# Patient Record
Sex: Male | Born: 1960 | Race: Black or African American | Hispanic: No | Marital: Single | Smoking: Never smoker
Health system: Southern US, Community
[De-identification: ages and names within clinical notes are randomized; demographics above are authoritative.]

## PROBLEM LIST (undated history)

## (undated) ENCOUNTER — Emergency Department (HOSPITAL_COMMUNITY): Admission: EM | Payer: Medicaid Other | Source: Home / Self Care

## (undated) DIAGNOSIS — J309 Allergic rhinitis, unspecified: Secondary | ICD-10-CM

## (undated) DIAGNOSIS — E1121 Type 2 diabetes mellitus with diabetic nephropathy: Secondary | ICD-10-CM

## (undated) DIAGNOSIS — N183 Chronic kidney disease, stage 3 unspecified: Secondary | ICD-10-CM

## (undated) DIAGNOSIS — E559 Vitamin D deficiency, unspecified: Secondary | ICD-10-CM

## (undated) DIAGNOSIS — E119 Type 2 diabetes mellitus without complications: Secondary | ICD-10-CM

## (undated) DIAGNOSIS — F79 Unspecified intellectual disabilities: Secondary | ICD-10-CM

## (undated) DIAGNOSIS — K219 Gastro-esophageal reflux disease without esophagitis: Secondary | ICD-10-CM

## (undated) DIAGNOSIS — I10 Essential (primary) hypertension: Secondary | ICD-10-CM

## (undated) DIAGNOSIS — E785 Hyperlipidemia, unspecified: Secondary | ICD-10-CM

## (undated) HISTORY — DX: Chronic kidney disease, stage 3 unspecified: N18.30

## (undated) HISTORY — DX: Type 2 diabetes mellitus without complications: E11.9

## (undated) HISTORY — DX: Allergic rhinitis, unspecified: J30.9

## (undated) HISTORY — DX: Gastro-esophageal reflux disease without esophagitis: K21.9

## (undated) HISTORY — DX: Hyperlipidemia, unspecified: E78.5

## (undated) HISTORY — DX: Unspecified intellectual disabilities: F79

## (undated) HISTORY — DX: Vitamin D deficiency, unspecified: E55.9

## (undated) HISTORY — DX: Type 2 diabetes mellitus with diabetic nephropathy: E11.21

## (undated) HISTORY — PX: APPENDECTOMY: SHX54

---

## 1998-04-19 ENCOUNTER — Emergency Department (HOSPITAL_COMMUNITY): Admission: EM | Admit: 1998-04-19 | Discharge: 1998-04-19 | Payer: Self-pay | Admitting: Emergency Medicine

## 1998-10-21 ENCOUNTER — Emergency Department (HOSPITAL_COMMUNITY): Admission: EM | Admit: 1998-10-21 | Discharge: 1998-10-21 | Payer: Self-pay | Admitting: Emergency Medicine

## 1998-10-30 ENCOUNTER — Encounter: Payer: Self-pay | Admitting: Emergency Medicine

## 1998-10-30 ENCOUNTER — Inpatient Hospital Stay (HOSPITAL_COMMUNITY): Admission: EM | Admit: 1998-10-30 | Discharge: 1998-11-01 | Payer: Self-pay | Admitting: Emergency Medicine

## 1998-11-03 ENCOUNTER — Encounter: Admission: RE | Admit: 1998-11-03 | Discharge: 1998-11-03 | Payer: Self-pay | Admitting: Internal Medicine

## 1998-11-06 ENCOUNTER — Emergency Department (HOSPITAL_COMMUNITY): Admission: EM | Admit: 1998-11-06 | Discharge: 1998-11-06 | Payer: Self-pay | Admitting: Emergency Medicine

## 1998-11-07 ENCOUNTER — Encounter: Payer: Self-pay | Admitting: Emergency Medicine

## 1998-11-29 ENCOUNTER — Encounter: Admission: RE | Admit: 1998-11-29 | Discharge: 1998-11-29 | Payer: Self-pay | Admitting: Internal Medicine

## 1999-02-01 ENCOUNTER — Encounter: Admission: RE | Admit: 1999-02-01 | Discharge: 1999-02-01 | Payer: Self-pay | Admitting: Internal Medicine

## 1999-06-28 ENCOUNTER — Encounter: Admission: RE | Admit: 1999-06-28 | Discharge: 1999-06-28 | Payer: Self-pay | Admitting: Internal Medicine

## 1999-07-01 ENCOUNTER — Encounter: Admission: RE | Admit: 1999-07-01 | Discharge: 1999-07-01 | Payer: Self-pay | Admitting: Internal Medicine

## 1999-12-13 ENCOUNTER — Encounter: Admission: RE | Admit: 1999-12-13 | Discharge: 1999-12-13 | Payer: Self-pay | Admitting: Internal Medicine

## 1999-12-23 ENCOUNTER — Encounter: Admission: RE | Admit: 1999-12-23 | Discharge: 1999-12-23 | Payer: Self-pay | Admitting: Internal Medicine

## 2000-01-13 ENCOUNTER — Encounter: Admission: RE | Admit: 2000-01-13 | Discharge: 2000-01-13 | Payer: Self-pay | Admitting: Internal Medicine

## 2000-05-29 ENCOUNTER — Encounter: Admission: RE | Admit: 2000-05-29 | Discharge: 2000-05-29 | Payer: Self-pay | Admitting: Internal Medicine

## 2000-10-30 ENCOUNTER — Emergency Department (HOSPITAL_COMMUNITY): Admission: EM | Admit: 2000-10-30 | Discharge: 2000-10-30 | Payer: Self-pay

## 2001-05-13 ENCOUNTER — Encounter: Payer: Self-pay | Admitting: Emergency Medicine

## 2001-05-13 ENCOUNTER — Emergency Department (HOSPITAL_COMMUNITY): Admission: EM | Admit: 2001-05-13 | Discharge: 2001-05-14 | Payer: Self-pay | Admitting: Emergency Medicine

## 2001-08-20 ENCOUNTER — Encounter: Admission: RE | Admit: 2001-08-20 | Discharge: 2001-08-20 | Payer: Self-pay | Admitting: Internal Medicine

## 2001-10-30 ENCOUNTER — Emergency Department (HOSPITAL_COMMUNITY): Admission: EM | Admit: 2001-10-30 | Discharge: 2001-10-30 | Payer: Self-pay | Admitting: Emergency Medicine

## 2001-12-21 ENCOUNTER — Emergency Department (HOSPITAL_COMMUNITY): Admission: EM | Admit: 2001-12-21 | Discharge: 2001-12-21 | Payer: Self-pay | Admitting: Emergency Medicine

## 2002-10-14 ENCOUNTER — Encounter: Payer: Self-pay | Admitting: Emergency Medicine

## 2002-10-14 ENCOUNTER — Emergency Department (HOSPITAL_COMMUNITY): Admission: EM | Admit: 2002-10-14 | Discharge: 2002-10-14 | Payer: Self-pay | Admitting: Emergency Medicine

## 2002-12-30 ENCOUNTER — Encounter: Admission: RE | Admit: 2002-12-30 | Discharge: 2002-12-30 | Payer: Self-pay | Admitting: Internal Medicine

## 2003-10-16 ENCOUNTER — Encounter: Admission: RE | Admit: 2003-10-16 | Discharge: 2003-10-16 | Payer: Self-pay | Admitting: Internal Medicine

## 2003-10-16 ENCOUNTER — Emergency Department (HOSPITAL_COMMUNITY): Admission: EM | Admit: 2003-10-16 | Discharge: 2003-10-16 | Payer: Self-pay | Admitting: Family Medicine

## 2003-10-30 ENCOUNTER — Encounter: Admission: RE | Admit: 2003-10-30 | Discharge: 2003-10-30 | Payer: Self-pay | Admitting: Internal Medicine

## 2004-05-19 ENCOUNTER — Ambulatory Visit: Payer: Self-pay | Admitting: Internal Medicine

## 2004-07-05 ENCOUNTER — Ambulatory Visit: Payer: Self-pay | Admitting: Internal Medicine

## 2004-07-12 ENCOUNTER — Emergency Department (HOSPITAL_COMMUNITY): Admission: EM | Admit: 2004-07-12 | Discharge: 2004-07-12 | Payer: Self-pay | Admitting: Emergency Medicine

## 2004-07-15 ENCOUNTER — Ambulatory Visit: Payer: Self-pay | Admitting: Internal Medicine

## 2004-08-23 ENCOUNTER — Ambulatory Visit: Payer: Self-pay | Admitting: Internal Medicine

## 2004-09-02 ENCOUNTER — Ambulatory Visit (HOSPITAL_COMMUNITY): Admission: RE | Admit: 2004-09-02 | Discharge: 2004-09-02 | Payer: Self-pay | Admitting: Internal Medicine

## 2004-10-13 ENCOUNTER — Emergency Department (HOSPITAL_COMMUNITY): Admission: EM | Admit: 2004-10-13 | Discharge: 2004-10-13 | Payer: Self-pay | Admitting: Family Medicine

## 2004-12-07 ENCOUNTER — Ambulatory Visit: Payer: Self-pay | Admitting: Internal Medicine

## 2005-06-26 ENCOUNTER — Emergency Department (HOSPITAL_COMMUNITY): Admission: EM | Admit: 2005-06-26 | Discharge: 2005-06-26 | Payer: Self-pay | Admitting: Family Medicine

## 2005-09-08 ENCOUNTER — Emergency Department (HOSPITAL_COMMUNITY): Admission: EM | Admit: 2005-09-08 | Discharge: 2005-09-08 | Payer: Self-pay | Admitting: Family Medicine

## 2005-10-05 ENCOUNTER — Emergency Department (HOSPITAL_COMMUNITY): Admission: EM | Admit: 2005-10-05 | Discharge: 2005-10-05 | Payer: Self-pay | Admitting: Emergency Medicine

## 2005-10-31 ENCOUNTER — Emergency Department (HOSPITAL_COMMUNITY): Admission: EM | Admit: 2005-10-31 | Discharge: 2005-10-31 | Payer: Self-pay | Admitting: Emergency Medicine

## 2005-11-07 ENCOUNTER — Ambulatory Visit: Payer: Self-pay | Admitting: Internal Medicine

## 2006-04-24 ENCOUNTER — Ambulatory Visit: Payer: Self-pay | Admitting: Internal Medicine

## 2006-05-23 DIAGNOSIS — R0789 Other chest pain: Secondary | ICD-10-CM

## 2006-05-23 DIAGNOSIS — B350 Tinea barbae and tinea capitis: Secondary | ICD-10-CM

## 2006-05-23 DIAGNOSIS — I10 Essential (primary) hypertension: Secondary | ICD-10-CM

## 2006-05-23 DIAGNOSIS — F70 Mild intellectual disabilities: Secondary | ICD-10-CM

## 2006-05-23 DIAGNOSIS — J309 Allergic rhinitis, unspecified: Secondary | ICD-10-CM | POA: Insufficient documentation

## 2006-05-23 DIAGNOSIS — I498 Other specified cardiac arrhythmias: Secondary | ICD-10-CM | POA: Insufficient documentation

## 2006-05-23 DIAGNOSIS — N259 Disorder resulting from impaired renal tubular function, unspecified: Secondary | ICD-10-CM | POA: Insufficient documentation

## 2006-05-23 DIAGNOSIS — K5900 Constipation, unspecified: Secondary | ICD-10-CM | POA: Insufficient documentation

## 2006-05-23 DIAGNOSIS — D539 Nutritional anemia, unspecified: Secondary | ICD-10-CM | POA: Insufficient documentation

## 2006-06-22 DIAGNOSIS — I10 Essential (primary) hypertension: Secondary | ICD-10-CM | POA: Insufficient documentation

## 2006-06-26 ENCOUNTER — Ambulatory Visit: Payer: Self-pay | Admitting: Internal Medicine

## 2006-10-25 ENCOUNTER — Telehealth: Payer: Self-pay | Admitting: *Deleted

## 2006-10-30 ENCOUNTER — Emergency Department (HOSPITAL_COMMUNITY): Admission: EM | Admit: 2006-10-30 | Discharge: 2006-10-30 | Payer: Self-pay | Admitting: Emergency Medicine

## 2006-12-12 ENCOUNTER — Telehealth: Payer: Self-pay | Admitting: *Deleted

## 2006-12-19 ENCOUNTER — Telehealth: Payer: Self-pay | Admitting: *Deleted

## 2007-04-15 ENCOUNTER — Telehealth: Payer: Self-pay | Admitting: *Deleted

## 2007-04-25 ENCOUNTER — Emergency Department (HOSPITAL_COMMUNITY): Admission: EM | Admit: 2007-04-25 | Discharge: 2007-04-25 | Payer: Self-pay | Admitting: Emergency Medicine

## 2007-05-29 ENCOUNTER — Ambulatory Visit: Payer: Self-pay | Admitting: Internal Medicine

## 2007-06-04 ENCOUNTER — Ambulatory Visit: Payer: Self-pay | Admitting: Internal Medicine

## 2007-06-04 LAB — CONVERTED CEMR LAB
ALT: 33 units/L (ref 0–53)
Albumin: 4.7 g/dL (ref 3.5–5.2)
Basophils Absolute: 0 10*3/uL (ref 0.0–0.1)
CO2: 28 meq/L (ref 19–32)
Calcium: 9.5 mg/dL (ref 8.4–10.5)
Chloride: 102 meq/L (ref 96–112)
Hemoglobin: 13.7 g/dL (ref 13.0–17.0)
Lymphocytes Relative: 39 % (ref 12–46)
Monocytes Absolute: 0.4 10*3/uL (ref 0.1–1.0)
Neutro Abs: 1.8 10*3/uL (ref 1.7–7.7)
Platelets: 400 10*3/uL (ref 150–400)
Potassium: 4.2 meq/L (ref 3.5–5.3)
RDW: 12.9 % (ref 11.5–15.5)
Sodium: 140 meq/L (ref 135–145)
TSH: 0.859 microintl units/mL (ref 0.350–5.50)
Total Protein: 7.4 g/dL (ref 6.0–8.3)

## 2007-06-19 ENCOUNTER — Ambulatory Visit: Payer: Self-pay | Admitting: Internal Medicine

## 2007-07-16 ENCOUNTER — Ambulatory Visit: Payer: Self-pay | Admitting: Internal Medicine

## 2007-07-16 LAB — CONVERTED CEMR LAB
Calcium: 9.8 mg/dL (ref 8.4–10.5)
Creatinine, Ser: 1.28 mg/dL (ref 0.40–1.50)
HDL: 44 mg/dL (ref >39)
Triglycerides: 62 mg/dL (ref <150)

## 2007-07-22 ENCOUNTER — Ambulatory Visit: Payer: Self-pay | Admitting: Internal Medicine

## 2007-08-26 ENCOUNTER — Ambulatory Visit: Payer: Self-pay | Admitting: Internal Medicine

## 2007-10-28 ENCOUNTER — Ambulatory Visit: Payer: Self-pay | Admitting: Family Medicine

## 2007-12-20 ENCOUNTER — Ambulatory Visit: Payer: Self-pay | Admitting: Internal Medicine

## 2008-04-16 ENCOUNTER — Ambulatory Visit: Payer: Self-pay | Admitting: Internal Medicine

## 2008-04-16 ENCOUNTER — Encounter: Payer: Self-pay | Admitting: Family Medicine

## 2008-04-16 LAB — CONVERTED CEMR LAB
CO2: 25 meq/L (ref 19–32)
Calcium: 9.5 mg/dL (ref 8.4–10.5)
Chloride: 103 meq/L (ref 96–112)
Creatinine, Ser: 1.41 mg/dL (ref 0.40–1.50)
Glucose, Bld: 108 mg/dL — ABNORMAL HIGH (ref 70–99)
LDL Cholesterol: 168 mg/dL — ABNORMAL HIGH (ref 0–99)
PSA: 0.4 ng/mL (ref 0.10–4.00)
Sodium: 139 meq/L (ref 135–145)
Total CHOL/HDL Ratio: 6

## 2008-04-22 ENCOUNTER — Ambulatory Visit: Payer: Self-pay | Admitting: Internal Medicine

## 2008-08-10 ENCOUNTER — Encounter: Payer: Self-pay | Admitting: Family Medicine

## 2008-08-10 ENCOUNTER — Ambulatory Visit: Payer: Self-pay | Admitting: Internal Medicine

## 2008-08-10 LAB — CONVERTED CEMR LAB
ALT: 27 units/L (ref 0–53)
AST: 30 units/L (ref 0–37)
CO2: 24 meq/L (ref 19–32)
Cholesterol: 183 mg/dL (ref 0–200)
LDL Cholesterol: 130 mg/dL — ABNORMAL HIGH (ref 0–99)
Sodium: 141 meq/L (ref 135–145)
Total Bilirubin: 0.7 mg/dL (ref 0.3–1.2)
Total CHOL/HDL Ratio: 4.8
Total Protein: 7.7 g/dL (ref 6.0–8.3)
VLDL: 15 mg/dL (ref 0–40)

## 2008-08-21 ENCOUNTER — Ambulatory Visit: Payer: Self-pay | Admitting: Internal Medicine

## 2008-09-18 ENCOUNTER — Ambulatory Visit: Payer: Self-pay | Admitting: Internal Medicine

## 2008-11-16 ENCOUNTER — Encounter: Payer: Self-pay | Admitting: Family Medicine

## 2008-11-16 ENCOUNTER — Ambulatory Visit: Payer: Self-pay | Admitting: Internal Medicine

## 2008-11-16 LAB — CONVERTED CEMR LAB
ALT: 34 units/L (ref 0–53)
AST: 27 units/L (ref 0–37)
Albumin: 4.4 g/dL (ref 3.5–5.2)
Alkaline Phosphatase: 65 units/L (ref 39–117)
BUN: 13 mg/dL (ref 6–23)
Calcium: 9.8 mg/dL (ref 8.4–10.5)
Chloride: 102 meq/L (ref 96–112)
LDL Cholesterol: 78 mg/dL (ref 0–99)
Potassium: 4.2 meq/L (ref 3.5–5.3)
Sodium: 139 meq/L (ref 135–145)
Total Protein: 7.3 g/dL (ref 6.0–8.3)

## 2008-11-23 ENCOUNTER — Ambulatory Visit: Payer: Self-pay | Admitting: Internal Medicine

## 2009-02-24 ENCOUNTER — Ambulatory Visit: Payer: Self-pay | Admitting: Internal Medicine

## 2009-06-30 ENCOUNTER — Ambulatory Visit: Payer: Self-pay | Admitting: Internal Medicine

## 2009-07-21 ENCOUNTER — Ambulatory Visit: Payer: Self-pay | Admitting: Internal Medicine

## 2009-07-21 LAB — CONVERTED CEMR LAB
CO2: 27 meq/L (ref 19–32)
Calcium: 10.2 mg/dL (ref 8.4–10.5)
Chloride: 99 meq/L (ref 96–112)
Glucose, Bld: 102 mg/dL — ABNORMAL HIGH (ref 70–99)
HDL: 41 mg/dL (ref >39)
Potassium: 4.4 meq/L (ref 3.5–5.3)
Sodium: 138 meq/L (ref 135–145)
Total CHOL/HDL Ratio: 4.9
VLDL: 16 mg/dL (ref 0–40)

## 2009-07-28 ENCOUNTER — Ambulatory Visit: Payer: Self-pay | Admitting: Internal Medicine

## 2009-08-25 ENCOUNTER — Ambulatory Visit: Payer: Self-pay | Admitting: Internal Medicine

## 2009-08-25 LAB — CONVERTED CEMR LAB
Calcium: 9.7 mg/dL (ref 8.4–10.5)
Glucose, Bld: 113 mg/dL — ABNORMAL HIGH (ref 70–99)
Hgb A1c MFr Bld: 5.5 % (ref 4.6–6.1)
Potassium: 4.1 meq/L (ref 3.5–5.3)
Sodium: 136 meq/L (ref 135–145)

## 2009-09-01 ENCOUNTER — Ambulatory Visit: Payer: Self-pay | Admitting: Internal Medicine

## 2009-09-30 ENCOUNTER — Ambulatory Visit: Payer: Self-pay | Admitting: Internal Medicine

## 2009-09-30 LAB — CONVERTED CEMR LAB
CO2: 25 meq/L (ref 19–32)
Calcium: 9 mg/dL (ref 8.4–10.5)
Chloride: 102 meq/L (ref 96–112)
Creatinine, Ser: 1.4 mg/dL (ref 0.40–1.50)
Sodium: 138 meq/L (ref 135–145)

## 2009-10-10 ENCOUNTER — Emergency Department (HOSPITAL_COMMUNITY): Admission: EM | Admit: 2009-10-10 | Discharge: 2009-10-10 | Payer: Self-pay | Admitting: Emergency Medicine

## 2009-11-18 ENCOUNTER — Ambulatory Visit: Payer: Self-pay | Admitting: Internal Medicine

## 2009-11-18 LAB — CONVERTED CEMR LAB
Albumin: 4.8 g/dL (ref 3.5–5.2)
BUN: 14 mg/dL (ref 6–23)
CO2: 30 meq/L (ref 19–32)
CRP: 0.3 mg/dL (ref <0.6)
Calcium: 10 mg/dL (ref 8.4–10.5)
Chloride: 96 meq/L (ref 96–112)
Eosinophils Relative: 3 % (ref 0–5)
Glucose, Bld: 104 mg/dL — ABNORMAL HIGH (ref 70–99)
HCT: 41.9 % (ref 39.0–52.0)
Hemoglobin: 14.2 g/dL (ref 13.0–17.0)
Lymphocytes Relative: 32 % (ref 12–46)
Microalb, Ur: 2.94 mg/dL — ABNORMAL HIGH (ref 0.00–1.89)
Monocytes Absolute: 0.3 10*3/uL (ref 0.1–1.0)
Monocytes Relative: 5 % (ref 3–12)
Neutro Abs: 3.5 10*3/uL (ref 1.7–7.7)
Potassium: 3.5 meq/L (ref 3.5–5.3)
RBC: 4.09 M/uL — ABNORMAL LOW (ref 4.22–5.81)
Sodium: 139 meq/L (ref 135–145)
Total Protein: 7.8 g/dL (ref 6.0–8.3)
Uric Acid, Serum: 9.1 mg/dL — ABNORMAL HIGH (ref 4.0–7.8)

## 2010-01-24 ENCOUNTER — Ambulatory Visit: Payer: Self-pay | Admitting: Internal Medicine

## 2010-02-21 ENCOUNTER — Ambulatory Visit: Payer: Self-pay | Admitting: Internal Medicine

## 2010-02-21 LAB — CONVERTED CEMR LAB
BUN: 19 mg/dL (ref 6–23)
Calcium: 9.7 mg/dL (ref 8.4–10.5)
Chloride: 99 meq/L (ref 96–112)
Cholesterol: 215 mg/dL — ABNORMAL HIGH (ref 0–200)
Creatinine, Ser: 1.26 mg/dL (ref 0.40–1.50)
LDL Cholesterol: 158 mg/dL — ABNORMAL HIGH (ref 0–99)
Triglycerides: 82 mg/dL (ref <150)

## 2010-05-09 ENCOUNTER — Encounter (INDEPENDENT_AMBULATORY_CARE_PROVIDER_SITE_OTHER): Payer: Self-pay | Admitting: *Deleted

## 2010-05-09 LAB — CONVERTED CEMR LAB
Albumin: 4.5 g/dL (ref 3.5–5.2)
Alkaline Phosphatase: 63 units/L (ref 39–117)
Calcium: 9.6 mg/dL (ref 8.4–10.5)
Chloride: 100 meq/L (ref 96–112)
Glucose, Bld: 114 mg/dL — ABNORMAL HIGH (ref 70–99)
LDL Cholesterol: 170 mg/dL — ABNORMAL HIGH (ref 0–99)
Potassium: 4 meq/L (ref 3.5–5.3)
Sodium: 139 meq/L (ref 135–145)
Total Protein: 7.5 g/dL (ref 6.0–8.3)
Triglycerides: 92 mg/dL (ref <150)

## 2010-07-06 ENCOUNTER — Inpatient Hospital Stay (HOSPITAL_COMMUNITY)
Admission: EM | Admit: 2010-07-06 | Discharge: 2010-07-23 | DRG: 339 | Disposition: A | Discharge status: Home / Self Care | Payer: Medicaid Other | Source: Ambulatory Visit | Attending: Surgery | Admitting: Surgery

## 2010-07-06 DIAGNOSIS — E860 Dehydration: Secondary | ICD-10-CM | POA: Diagnosis present

## 2010-07-06 DIAGNOSIS — K3533 Acute appendicitis with perforation and localized peritonitis, with abscess: Principal | ICD-10-CM | POA: Diagnosis present

## 2010-07-06 DIAGNOSIS — E7800 Pure hypercholesterolemia, unspecified: Secondary | ICD-10-CM | POA: Diagnosis present

## 2010-07-06 DIAGNOSIS — K929 Disease of digestive system, unspecified: Secondary | ICD-10-CM | POA: Diagnosis not present

## 2010-07-06 DIAGNOSIS — E876 Hypokalemia: Secondary | ICD-10-CM | POA: Diagnosis present

## 2010-07-06 DIAGNOSIS — F79 Unspecified intellectual disabilities: Secondary | ICD-10-CM | POA: Diagnosis present

## 2010-07-06 DIAGNOSIS — K560 Paralytic ileus: Secondary | ICD-10-CM | POA: Diagnosis not present

## 2010-07-06 DIAGNOSIS — N179 Acute kidney failure, unspecified: Secondary | ICD-10-CM | POA: Diagnosis present

## 2010-07-06 HISTORY — DX: Essential (primary) hypertension: I10

## 2010-07-06 LAB — POCT I-STAT, CHEM 8
BUN: 22 mg/dL (ref 6–23)
Calcium, Ion: 1 mmol/L — ABNORMAL LOW (ref 1.12–1.32)
Chloride: 94 mEq/L — ABNORMAL LOW (ref 96–112)
Creatinine, Ser: 2.2 mg/dL — ABNORMAL HIGH (ref 0.4–1.5)
Glucose, Bld: 116 mg/dL — ABNORMAL HIGH (ref 70–99)
HCT: 43 % (ref 39.0–52.0)
Hemoglobin: 14.6 g/dL (ref 13.0–17.0)
Potassium: 2.6 mEq/L — CL (ref 3.5–5.1)
Sodium: 135 mEq/L (ref 135–145)
TCO2: 32 mmol/L (ref 0–100)

## 2010-07-06 LAB — DIFFERENTIAL
Basophils Absolute: 0 10*3/uL (ref 0.0–0.1)
Basophils Relative: 0 % (ref 0–1)
Lymphocytes Relative: 4 % — ABNORMAL LOW (ref 12–46)
Monocytes Absolute: 1 10*3/uL (ref 0.1–1.0)
Neutro Abs: 20.3 10*3/uL — ABNORMAL HIGH (ref 1.7–7.7)
Neutrophils Relative %: 91 % — ABNORMAL HIGH (ref 43–77)

## 2010-07-06 LAB — CBC
HCT: 38.4 % — ABNORMAL LOW (ref 39.0–52.0)
Hemoglobin: 13.1 g/dL (ref 13.0–17.0)
MCHC: 34.1 g/dL (ref 30.0–36.0)
RBC: 3.76 MIL/uL — ABNORMAL LOW (ref 4.22–5.81)
WBC: 22.2 10*3/uL — ABNORMAL HIGH (ref 4.0–10.5)

## 2010-07-06 LAB — URINE MICROSCOPIC-ADD ON

## 2010-07-06 LAB — URINALYSIS, ROUTINE W REFLEX MICROSCOPIC
Protein, ur: 100 mg/dL — AB
Specific Gravity, Urine: 1.019 (ref 1.005–1.030)
Urine Glucose, Fasting: NEGATIVE mg/dL

## 2010-07-07 LAB — CBC
Hemoglobin: 11.6 g/dL — ABNORMAL LOW (ref 13.0–17.0)
MCH: 34.4 pg — ABNORMAL HIGH (ref 26.0–34.0)
MCHC: 34.3 g/dL (ref 30.0–36.0)
MCV: 100.3 fL — ABNORMAL HIGH (ref 78.0–100.0)
Platelets: 315 10*3/uL (ref 150–400)
RBC: 3.37 MIL/uL — ABNORMAL LOW (ref 4.22–5.81)

## 2010-07-07 LAB — URINE CULTURE
Colony Count: NO GROWTH
Culture  Setup Time: 201201252011
Culture: NO GROWTH

## 2010-07-07 LAB — BASIC METABOLIC PANEL
BUN: 16 mg/dL (ref 6–23)
BUN: 15 mg/dL (ref 6–23)
CO2: 25 mEq/L (ref 19–32)
CO2: 26 mEq/L (ref 19–32)
Chloride: 101 mEq/L (ref 96–112)
Chloride: 101 mEq/L (ref 96–112)
Creatinine, Ser: 1.68 mg/dL — ABNORMAL HIGH (ref 0.4–1.5)
Glucose, Bld: 105 mg/dL — ABNORMAL HIGH (ref 70–99)
Potassium: 2.9 mEq/L — ABNORMAL LOW (ref 3.5–5.1)

## 2010-07-08 LAB — CBC
HCT: 33 % — ABNORMAL LOW (ref 39.0–52.0)
MCHC: 34.2 g/dL (ref 30.0–36.0)
RDW: 12.8 % (ref 11.5–15.5)

## 2010-07-08 LAB — PTH, INTACT AND CALCIUM
Calcium, Total (PTH): 8.3 mg/dL — ABNORMAL LOW (ref 8.4–10.5)
PTH: 43.9 pg/mL (ref 14.0–72.0)

## 2010-07-08 LAB — COMPREHENSIVE METABOLIC PANEL
AST: 43 U/L — ABNORMAL HIGH (ref 0–37)
CO2: 27 mEq/L (ref 19–32)
Calcium: 8.6 mg/dL (ref 8.4–10.5)
Creatinine, Ser: 1.48 mg/dL (ref 0.4–1.5)
GFR calc Af Amer: >60 mL/min (ref >60)
GFR calc non Af Amer: 50 mL/min — ABNORMAL LOW (ref >60)

## 2010-07-08 LAB — LACTIC ACID, PLASMA: Lactic Acid, Venous: 0.9 mmol/L (ref 0.5–2.2)

## 2010-07-08 NOTE — Consult Note (Signed)
NAME:  Travis Barrera, HUOT NO.:  [id number]  MEDICAL RECORD NO.:  [id number]          PATIENT TYPE:  INP  LOCATION:  6739                         FACILITY:  [facility]  PHYSICIAN:  Jaqwan L. Dollie Bressi, M.D.DATE OF BIRTH:  09/01/1960  DATE OF CONSULTATION: DATE OF DISCHARGE:                                CONSULTATION   REASON FOR CONSULT:  Renal insufficiency.  HISTORY OF PRESENT ILLNESS:  This is a 50 year old gentleman with a history of mental retardation, history of hypertension, and hyperlipidemia that was treated about 3-4 months.  He presented with over 24 hours of refractory nausea, vomiting, and severe abdominal pain caused him to curl up.  His CT in the ER was suspicious for appendicitis.  He was admitted for evaluation of that.  He has been febrile.  Also, he is on IV antibiotics.  Potassium is low in the emergency room.  His phosphorus is 1.  Creatinine was 2.2 on admission and is 1.68 today.  No known history of renal disease.  No history of UTIs, stones, or family history of renal disease.  Question his meds, he and his mother do not know them, but there is some mention in the ER chart of lisinopril and hydrochlorothiazide, but no mention of the cholesterol medicine, they know what they are on.  He has no nonsteroidal ingestion.  He has SULFA allergies.  He has no history of arthritis, rashes, nonsteroidal ingestion.  He has nocturia x1, but no dysuria or hematuria.  He has no chest pain or shortness of breath.  REVIEW OF SYSTEMS:  HEENT:  No headaches, visual difficulties, hearing difficulties, sores in eyes, dry mouth.  PULMONARY:  Never been a smoker.  No cough or sputum production.  GI:  As above.  No diarrhea. SKIN:  Dry skin especially on his legs.  MUSCULOSKELETAL:  No specific complaints.  NEUROLOGICAL:  No specific complaints.  The mother and the son are the historians.  PAST MEDICAL HISTORY:  Includes history of hyperlipidemia  and hypertension.  MEDICATIONS:  As listed above.  No known operative procedures.  FAMILY HISTORY:  Mother has diabetes and diabetes in her mother, but also hypertension is very prominent in family.  Father's family history is not known.  SOCIAL HISTORY:  He lives with his mother and is unemployed.  Nonsmoker, nondrinker.  OBJECTIVE:  GENERAL:  No acute distress.  He is cooperative but only phrases and one-word answers.  He is crawled up protecting his abdomen, but he is cooperative. VITAL SIGNS:  Blood pressure 149/89, temperature 102.9 earlier, 95% on room air. HEENT:  Fundi, disks, and pharynx unremarkable. NECK:  Without masses or thyromegaly. LUNGS:  No rales, rhonchi, or wheeze.  Slight decreased breath sounds, decreased expansion, normal to percussion. CARDIOVASCULAR:  Regular rate and rhythm.  No S4.  Grade 1-2/6 systolic ejection murmur best heard at left upper sternal border.  PMI is 10 __________  midsternal and 5th intercostal space.  No significant edema. Pulses 2+/4+.  No S3 or thrills.  No bruits. ABDOMEN:  He has bowel sounds, but they are much decreased and very erratic.  Abdomen  is distended and tender, moderate.  He has an NG tube in. MUSCULOSKELETAL:  Unremarkable. NEUROLOGICAL:  He moves all extremities.  Motor is not tested extensively because of his pain.  He has symmetric facies.  Cranial nerves II through XII grossly intact.  Hemoglobin is 11.6, white count 14.2, platelets 215,000.  Urinalysis:  0- 2 white cells, 0-2 red cells, 100 mg percent of protein and moderate ketones.  He has creatinine in 2002 of 1.4.  The last electrolytes drawn here; sodium 136, potassium 3, chloride 101, bicarbonate of 25, creatinine 1.68, BUN 16, glucose 104, phosphorus is 1.  ASSESSMENT: 1. Kidney injury:  Most likely this is volume depletion in the setting     of an acute __________ ACE inhibitor.  Need to rule out other     things especially in the setting of  proteinuria and followup on     that.  Decreased potassium is related to his vomiting.  Decrease     phosphorus including volume shifts and alkalemia.  Must question if     there may be some __________ with his renal insufficiency, he have     secondary hyperparathyroidism. 2. Hypertension:  Not indicated for treatment at this time. 3. Hypercholesterolemia:  Not indicated for treatment at this time. 4. Probable appendicitis:  Per General Surgery.  PLAN: 1. IV sodium phosphate. 2. Potassium chloride. 3. Ultrasound. 4. IV fluids. 5. Urine sodium and creatinine.          ______________________________ [place] [employer]. Novali Vollman, M.D.     JLD/MEDQ  D:  07/07/2010  T:  07/08/2010  Job:  [id number]  Electronically Signed by Beryle Lathe M.D. on 07/08/2010 04:31:03 PM

## 2010-07-09 LAB — COMPREHENSIVE METABOLIC PANEL
CO2: 27 mEq/L (ref 19–32)
Calcium: 8.9 mg/dL (ref 8.4–10.5)
Creatinine, Ser: 1.55 mg/dL — ABNORMAL HIGH (ref 0.4–1.5)
GFR calc non Af Amer: 48 mL/min — ABNORMAL LOW (ref >60)
Glucose, Bld: 96 mg/dL (ref 70–99)

## 2010-07-09 LAB — CBC
HCT: 33.8 % — ABNORMAL LOW (ref 39.0–52.0)
Hemoglobin: 11.6 g/dL — ABNORMAL LOW (ref 13.0–17.0)
WBC: 12 10*3/uL — ABNORMAL HIGH (ref 4.0–10.5)

## 2010-07-09 LAB — MAGNESIUM: Magnesium: 2.5 mg/dL (ref 1.5–2.5)

## 2010-07-10 LAB — BASIC METABOLIC PANEL
Calcium: 8.5 mg/dL (ref 8.4–10.5)
GFR calc Af Amer: >60 mL/min (ref >60)
GFR calc non Af Amer: 54 mL/min — ABNORMAL LOW (ref >60)
Glucose, Bld: 108 mg/dL — ABNORMAL HIGH (ref 70–99)
Sodium: 138 mEq/L (ref 135–145)

## 2010-07-11 LAB — CBC
MCH: 34 pg (ref 26.0–34.0)
Platelets: 474 10*3/uL — ABNORMAL HIGH (ref 150–400)
RBC: 3.09 MIL/uL — ABNORMAL LOW (ref 4.22–5.81)
RDW: 13.5 % (ref 11.5–15.5)
WBC: 21.7 10*3/uL — ABNORMAL HIGH (ref 4.0–10.5)

## 2010-07-11 LAB — RENAL FUNCTION PANEL
Albumin: 2.8 g/dL — ABNORMAL LOW (ref 3.5–5.2)
Chloride: 98 mEq/L (ref 96–112)
Creatinine, Ser: 1.4 mg/dL (ref 0.4–1.5)
GFR calc Af Amer: >60 mL/min (ref >60)
GFR calc non Af Amer: 54 mL/min — ABNORMAL LOW (ref >60)
Phosphorus: 3.3 mg/dL (ref 2.3–4.6)

## 2010-07-12 LAB — BASIC METABOLIC PANEL
BUN: 12 mg/dL (ref 6–23)
CO2: 25 mEq/L (ref 19–32)
Calcium: 8.6 mg/dL (ref 8.4–10.5)
Creatinine, Ser: 1.39 mg/dL (ref 0.4–1.5)
GFR calc non Af Amer: 54 mL/min — ABNORMAL LOW (ref >60)
Glucose, Bld: 145 mg/dL — ABNORMAL HIGH (ref 70–99)
Sodium: 133 mEq/L — ABNORMAL LOW (ref 135–145)

## 2010-07-12 LAB — PROTIME-INR
INR: 1.22 (ref 0.00–1.49)
Prothrombin Time: 15.6 seconds — ABNORMAL HIGH (ref 11.6–15.2)

## 2010-07-12 LAB — CBC
Hemoglobin: 10.2 g/dL — ABNORMAL LOW (ref 13.0–17.0)
MCHC: 34.3 g/dL (ref 30.0–36.0)
Platelets: 515 10*3/uL — ABNORMAL HIGH (ref 150–400)
RDW: 13.5 % (ref 11.5–15.5)

## 2010-07-12 LAB — TSH: TSH: 1.656 u[IU]/mL (ref 0.350–4.500)

## 2010-07-13 LAB — BASIC METABOLIC PANEL
BUN: 11 mg/dL (ref 6–23)
Chloride: 100 mEq/L (ref 96–112)
GFR calc non Af Amer: 55 mL/min — ABNORMAL LOW (ref >60)
Glucose, Bld: 140 mg/dL — ABNORMAL HIGH (ref 70–99)
Potassium: 4.5 mEq/L (ref 3.5–5.1)
Sodium: 135 mEq/L (ref 135–145)

## 2010-07-13 LAB — MAGNESIUM: Magnesium: 2.4 mg/dL (ref 1.5–2.5)

## 2010-07-13 LAB — CBC
HCT: 29.4 % — ABNORMAL LOW (ref 39.0–52.0)
MCV: 99.7 fL (ref 78.0–100.0)
Platelets: 661 10*3/uL — ABNORMAL HIGH (ref 150–400)
RBC: 2.95 MIL/uL — ABNORMAL LOW (ref 4.22–5.81)
WBC: 24 10*3/uL — ABNORMAL HIGH (ref 4.0–10.5)

## 2010-07-14 ENCOUNTER — Inpatient Hospital Stay (HOSPITAL_COMMUNITY): Payer: Medicaid Other

## 2010-07-14 LAB — CBC
HCT: 31.6 % — ABNORMAL LOW (ref 39.0–52.0)
Hemoglobin: 10.8 g/dL — ABNORMAL LOW (ref 13.0–17.0)
MCHC: 34.2 g/dL (ref 30.0–36.0)
RBC: 3.17 MIL/uL — ABNORMAL LOW (ref 4.22–5.81)

## 2010-07-15 LAB — CULTURE, ROUTINE-ABSCESS

## 2010-07-15 LAB — CBC
MCH: 34 pg (ref 26.0–34.0)
MCHC: 33.7 g/dL (ref 30.0–36.0)
Platelets: 796 10*3/uL — ABNORMAL HIGH (ref 150–400)
RBC: 2.59 MIL/uL — ABNORMAL LOW (ref 4.22–5.81)

## 2010-07-15 LAB — BASIC METABOLIC PANEL
Calcium: 8.3 mg/dL — ABNORMAL LOW (ref 8.4–10.5)
Creatinine, Ser: 1.3 mg/dL (ref 0.4–1.5)
GFR calc Af Amer: >60 mL/min (ref >60)
Sodium: 139 mEq/L (ref 135–145)

## 2010-07-15 LAB — PHOSPHORUS: Phosphorus: 3.4 mg/dL (ref 2.3–4.6)

## 2010-07-15 LAB — MAGNESIUM: Magnesium: 2.4 mg/dL (ref 1.5–2.5)

## 2010-07-16 LAB — CBC
MCHC: 33.5 g/dL (ref 30.0–36.0)
Platelets: 885 10*3/uL — ABNORMAL HIGH (ref 150–400)
RDW: 14.2 % (ref 11.5–15.5)
WBC: 19.3 10*3/uL — ABNORMAL HIGH (ref 4.0–10.5)

## 2010-07-17 LAB — CBC
Platelets: 938 10*3/uL (ref 150–400)
RBC: 2.9 MIL/uL — ABNORMAL LOW (ref 4.22–5.81)
WBC: 21.4 10*3/uL — ABNORMAL HIGH (ref 4.0–10.5)

## 2010-07-18 ENCOUNTER — Inpatient Hospital Stay (HOSPITAL_COMMUNITY): Payer: Medicaid Other

## 2010-07-18 ENCOUNTER — Encounter (HOSPITAL_COMMUNITY): Payer: Self-pay

## 2010-07-18 LAB — CBC
HCT: 31.8 % — ABNORMAL LOW (ref 39.0–52.0)
Hemoglobin: 10.6 g/dL — ABNORMAL LOW (ref 13.0–17.0)
RBC: 3.16 MIL/uL — ABNORMAL LOW (ref 4.22–5.81)
RDW: 13.7 % (ref 11.5–15.5)
WBC: 15.1 10*3/uL — ABNORMAL HIGH (ref 4.0–10.5)

## 2010-07-18 MED ORDER — IOHEXOL 300 MG/ML  SOLN
100.0000 mL | Freq: Once | INTRAMUSCULAR | Status: AC | PRN
  Administered 2010-07-18: 100 mL via INTRAVENOUS

## 2010-07-19 ENCOUNTER — Inpatient Hospital Stay (HOSPITAL_COMMUNITY): Payer: Medicaid Other

## 2010-07-19 LAB — CBC
HCT: 29.5 % — ABNORMAL LOW (ref 39.0–52.0)
Hemoglobin: 9.9 g/dL — ABNORMAL LOW (ref 13.0–17.0)
MCV: 100.7 fL — ABNORMAL HIGH (ref 78.0–100.0)
Platelets: 892 10*3/uL — ABNORMAL HIGH (ref 150–400)
RBC: 2.93 MIL/uL — ABNORMAL LOW (ref 4.22–5.81)
WBC: 9.6 10*3/uL (ref 4.0–10.5)

## 2010-07-19 LAB — COMPREHENSIVE METABOLIC PANEL
Albumin: 2.3 g/dL — ABNORMAL LOW (ref 3.5–5.2)
Alkaline Phosphatase: 123 U/L — ABNORMAL HIGH (ref 39–117)
BUN: 13 mg/dL (ref 6–23)
Chloride: 99 mEq/L (ref 96–112)
Potassium: 4.4 mEq/L (ref 3.5–5.1)
Total Bilirubin: 0.7 mg/dL (ref 0.3–1.2)

## 2010-07-19 LAB — ANAEROBIC CULTURE

## 2010-07-20 ENCOUNTER — Inpatient Hospital Stay (HOSPITAL_COMMUNITY): Payer: Medicaid Other

## 2010-07-20 LAB — BASIC METABOLIC PANEL
Calcium: 8.2 mg/dL — ABNORMAL LOW (ref 8.4–10.5)
Creatinine, Ser: 1.22 mg/dL (ref 0.4–1.5)
GFR calc Af Amer: >60 mL/min (ref >60)
GFR calc non Af Amer: >60 mL/min (ref >60)

## 2010-07-20 LAB — CBC
MCH: 33.9 pg (ref 26.0–34.0)
MCHC: 33.7 g/dL (ref 30.0–36.0)
Platelets: 889 10*3/uL — ABNORMAL HIGH (ref 150–400)
RBC: 2.89 MIL/uL — ABNORMAL LOW (ref 4.22–5.81)

## 2010-07-20 NOTE — H&P (Signed)
NAME:  Travis Barrera, Travis Barrera NO.:  [id number]  MEDICAL RECORD NO.:  [id number]          PATIENT TYPE:  EMS  LOCATION:  MAJO                         FACILITY:  [facility]  PHYSICIAN:  Almond Lint, MD       DATE OF BIRTH:  12/31/1960  DATE OF ADMISSION:  07/06/2010 DATE OF DISCHARGE:                             HISTORY & PHYSICAL   REFERRING PROVIDER:  Hassan Buckler. Caporossi, MD.  CHIEF COMPLAINT:  Abdominal pain, nausea, and vomiting.  HISTORY OF PRESENT ILLNESS:  Travis Barrera is a 50 year old male with mild mental retardation who according to himself and his brother presents with abdominal pain for around 24 hours.  He also threw up all last night and does not have any appetite.  His brother thinks that he did not act entirely normal on Monday, but he did not complain of pain and did not know about any vomiting.  On Sunday, he spent his whole day at church, which is his regular pattern.  He has not had diarrhea or constipation.  He denies prior surgery.  He has never experienced pain like this before.  Pain is worse with movement and palpation.  He does not complain of any dysuria.  No chest pain or shortness of breath.  He denies fevers and chills.  PAST MEDICAL HISTORY:  Hypertension, hypercholesterolemia, mental retardation.  PAST SURGICAL HISTORY:  Negative.  FAMILY HISTORY:  He is unable to provide.  SOCIAL HISTORY:  No substance abuse.  ALLERGIES:  SULFA DRUGS.  MEDICATIONS:  None.  REVIEW OF SYSTEMS:  Otherwise negative x11 according to the patient, although it is unclear how reliable he is.  PHYSICAL EXAMINATION:  VITAL SIGNS:  Temperature 102, pulse 84, respiratory rate 20, blood pressure 132/69, 96% on room air. GENERAL:  He actually is alert and oriented x3, in no acute distress, sitting comfortably on the bed watching TV. HEENT:  Normocephalic, atraumatic.  Sclerae are anicteric. NECK:  Supple.  No lymphadenopathy.  No thyromegaly. HEART:  Regular  rate and rhythm.  No murmurs. LUNGS:  Clear bilaterally.  No wheezes. ABDOMEN:  Distended.  He does not make any facial expressions when pressing on any part of his abdomen, but he does say it hurts on the right lower quadrant.  He also says it hurts in the right lower quadrant when I press on the left lower quadrant.  He has no rebound.  He denies tenderness in the upper abdomen.  He has hypoactive bowel sounds. EXTREMITIES:  Warm, well-perfused without pitting edema. SKIN:  No evidence of rashes. NEURO:  He is very functional despite a history of mental retardation and does not appear to have any gross motor or sensory deficits.  LABS:  UA showed some hemoglobin and trace LE.  CBC:  White count of 22.2, hemoglobin/hematocrit of 13.1 and 38.4, platelet count of 377,000. Sodium 135, potassium 2.6, chloride 94, CO2 32, BUN 22, creatinine 2.2, and glucose 116.  CT scan shows abnormal inflammatory changes and external gas in the right lower quadrant suspicious for ruptured appendicitis.  There are inflammatory changes in the cecum going up to ascending  colon.  IMPRESSION:  Travis Barrera is a 50 year old male with probable ruptured appendicitis versus colitis.  He also has acute renal insufficiency, dehydration, and hypokalemia.  We will admit him and give him IV fluids, put him on bowel rest, give him IV antibiotics, recheck his creatinine, and replete the potassium with recheck in the morning.  Given the level of distention that he has and the rupture without significant fragments of the appendix to find, we are going to try him on IV antibiotics with bowel rest.  He certainly may develop worsening pain and may require operative treatment.  It seems to me like he probably has had some mild symptoms, perhaps from a retrocecal appendix that he has not necessarily noted.  It is unlikely to have 24 hours of abdominal pain and have a ruptured appendix.  I think his chances of having conversion  from laparoscopic to open procedure are extremely high, given the level of distention that he has in his abdomen.  He also is at risk for abscess with surgery and without surgery given the fact that his appendix is probably in fragments and according to the CT scan.     Almond Lint, MD     FB/MEDQ  D:  07/06/2010  T:  07/07/2010  Job:  [id number]  Electronically Signed by Almond Lint MD on 07/20/2010 12:24:46 PM

## 2010-07-21 LAB — BASIC METABOLIC PANEL
Calcium: 8.2 mg/dL — ABNORMAL LOW (ref 8.4–10.5)
GFR calc Af Amer: >60 mL/min (ref >60)
GFR calc non Af Amer: >60 mL/min (ref >60)
Sodium: 132 mEq/L — ABNORMAL LOW (ref 135–145)

## 2010-07-22 LAB — BASIC METABOLIC PANEL
GFR calc non Af Amer: >60 mL/min (ref >60)
Potassium: 4 mEq/L (ref 3.5–5.1)
Sodium: 133 mEq/L — ABNORMAL LOW (ref 135–145)

## 2010-08-01 NOTE — Discharge Summary (Signed)
  NAME:  Travis Barrera, Travis Barrera NO.:  [id number]  MEDICAL RECORD NO.:  [id number]           PATIENT TYPE:  I  LOCATION:  5529                         FACILITY:  [facility]  PHYSICIAN:  Mary Sella. Andrey Campanile, MD     DATE OF BIRTH:  1961-04-09  DATE OF ADMISSION:  07/06/2010 DATE OF DISCHARGE:                              DISCHARGE SUMMARY   DISCHARGE DIAGNOSES: 1. Ruptured appendicitis with abscess. 2. Prolonged postoperative ileus, resolving. 3. Renal insufficiency improved. 4. History of hypertension. 5. Hypercholesterolemia. 6. Mental retardation.  PROCEDURES:  Exploratory laparotomy and appendectomy as well as drainage of retroperitoneal abscess on July 12, 2010, Dr. Darnell Level.  HISTORY:  This is a 50 year old [place] male, with some mental retardation who was admitted on July 06, 2010, with a perforated appendicitis on CT scan.  He was treated initially with intravenous antibiotics and bowel rest but did not appear to be defervescing.  A followup CT scan showed progression of the inflammatory changes and he was therefore taken to the OR for exploratory laparotomy with appendectomy and drainage of his retroperitoneal abscess on July 12, 2010, per Dr. Gerrit Friends.  Postoperatively, the patient had some worsening renal insufficiency and was seen by the renal service.  He has since improved.  He had a very slow improvement and resolution of his postoperative ileus.  He was covered with intravenous Invanz postoperatively with cultures growing E-coli, some streptococcal species, beta hemolytic unable able to type and moderate Bacteroides fragilis.  His white count normalized and his abdominal exam gradually improved and at this point he is tolerating liquids well.  I suspect that he can be advanced to a regular diet over the next 24 hours and likely discharge to home with his family.  He is having bowel movements following Dulcolax suppositories but has had no  bowel movements without the stimulation.  He was started on low-dose Reglan and this does appear to be helping with his ileus symptoms.  This will be continued for 7 days postdischarge.  At discharge, he will continue on a regular diet.  MEDICATIONS:  At discharge include; 1. Reglan 5 mg q.i.d. a.c. and nightly x1 more week. 2. Dulcolax suppositories p.r.n. 3. Crestor 10 mg p.o. daily. 4. Cetirizine 10 mg nightly. 5. Chlorthalidone 25 mg p.o. daily.  He will follow up with the [facility] on August 09, 2010 at 2:15 p.m. or sooner should he have difficulties in the interim.     Shawn Rayburn, P.A.   ______________________________ Mary Sella. Andrey Campanile, MD    SR/MEDQ  D:  07/22/2010  T:  07/23/2010  Job:  [id number]  cc:   [facility]  Electronically Signed by Lazaro Arms P.A. on 07/25/2010 03:13:30 PM Electronically Signed by Gaynelle Adu M.D. on 08/01/2010 08:17:03 AM

## 2010-08-04 NOTE — Op Note (Signed)
NAME:  Travis Barrera, Travis Barrera NO.:  [id number]  MEDICAL RECORD NO.:  [id number]          PATIENT TYPE:  INP  LOCATION:  5529                         FACILITY:  [facility]  PHYSICIAN:  Velora Heckler, MD      DATE OF BIRTH:  10-17-1960  DATE OF PROCEDURE:  07/12/2010 DATE OF DISCHARGE:                              OPERATIVE REPORT   PREOPERATIVE DIAGNOSIS:  Perforated appendicitis.  POSTOPERATIVE DIAGNOSIS:  Perforated appendicitis with abscess.  PROCEDURES: 1. Exploratory laparotomy. 2. Appendectomy. 3. Drainage, retroperitoneal abscess.  SURGEON:  Velora Heckler, MD  ASSISTANT:  Eber Hong, PA  ANESTHESIA:  General per Dr. Judie Petit.  ESTIMATED BLOOD LOSS:  Minimal.  PREPARATION:  ChloraPrep.  COMPLICATIONS:  None.  INDICATIONS:  The patient is a 50 year old black male admitted on July 06, 2010 with perforated appendicitis.  The patient was treated with intravenous antibiotics and bowel rest.  However, a followup CT scan showed progression of inflammatory changes.  He is therefore prepared and brought to the operating room for appendectomy.  BODY OF REPORT:  Procedure was done in OR #17 at the [facility].  The patient was brought to the operating room and placed in supine position on the operating room table.  Following administration of general anesthesia, the patient was prepped and draped in the usual strict aseptic fashion.  After ascertaining that an adequate level of anesthesia had been achieved, a midline abdominal incision was made with a #10 blade.  Dissection was carried into the peritoneal cavity.  Exploration reveals a large inflammatory mass in the right lower quadrant.  Loops of small bowel are adherent to the mass. These were gently mobilized with blunt dissection.  Upon mobilizing a distal loop of ileum, the mesentery of which formed the wall of a large abscess.  The abscess was opened and white to  cloudy yellow fluid was evacuated.  There was approximately 60-90 mL of fluid within the abscess.  The abscess tracks into the retroperitoneum in a retrocecal location.  Abscess cavity was broken up with blunt digital dissection. The cavity was irrigated copiously with warm saline and packed with a dry gauze sponge.  Remaining small bowel loops were mobilized off the cecum and area of inflammation.  Exploration reveals what appears to be the base of the appendix on the cecal wall.  This was doubly suture ligated with 2-0 silk suture ligatures.  The inflammatory mass and surrounding adipose tissue was then excised en bloc.  This appears to contain the perforated appendix.  It was submitted in its entirety to pathology for review.  Hemostasis was obtained with the electrocautery.  Packs were removed.  A 19-French Blake drain was brought in from a right lower quadrant stab wound and placed into the abscess cavity.  It was secured to the skin with a 3-0 nylon suture.  Abdomen was irrigated with warm saline which was evacuated.  Nasogastric tube was properly positioned within the stomach.  Omentum was used to cover the small bowel.  Midline incision was closed with interrupted #1 Vicryl simple sutures.  Subcutaneous tissues were  irrigated.  Skin was closed with stainless steel staples.  Wound was washed and dried and sterile dressings were applied.  Drain was placed to bulb suction.  The patient was awakened from anesthesia and brought to the recovery room. The patient tolerated the procedure well.     Velora Heckler, MD     TMG/MEDQ  D:  07/12/2010  T:  07/13/2010  Job:  [id number]  Electronically Signed by Darnell Level MD on 08/04/2010 10:34:24 AM

## 2011-03-21 LAB — POCT URINALYSIS DIP (DEVICE)
Bilirubin Urine: NEGATIVE
Ketones, ur: NEGATIVE
Operator id: 239701
Protein, ur: NEGATIVE

## 2013-05-01 ENCOUNTER — Emergency Department (HOSPITAL_COMMUNITY): Payer: Medicaid Other

## 2013-05-01 ENCOUNTER — Encounter (HOSPITAL_COMMUNITY): Payer: Self-pay | Admitting: Emergency Medicine

## 2013-05-01 ENCOUNTER — Emergency Department (HOSPITAL_COMMUNITY)
Admission: EM | Admit: 2013-05-01 | Discharge: 2013-05-01 | Disposition: A | Discharge status: Home / Self Care | Payer: Medicaid Other | Attending: Emergency Medicine | Admitting: Emergency Medicine

## 2013-05-01 DIAGNOSIS — M1710 Unilateral primary osteoarthritis, unspecified knee: Secondary | ICD-10-CM | POA: Insufficient documentation

## 2013-05-01 DIAGNOSIS — M25562 Pain in left knee: Secondary | ICD-10-CM

## 2013-05-01 DIAGNOSIS — I10 Essential (primary) hypertension: Secondary | ICD-10-CM | POA: Insufficient documentation

## 2013-05-01 DIAGNOSIS — Z79899 Other long term (current) drug therapy: Secondary | ICD-10-CM | POA: Insufficient documentation

## 2013-05-01 DIAGNOSIS — IMO0002 Reserved for concepts with insufficient information to code with codable children: Secondary | ICD-10-CM | POA: Insufficient documentation

## 2013-05-01 DIAGNOSIS — G8911 Acute pain due to trauma: Secondary | ICD-10-CM | POA: Insufficient documentation

## 2013-05-01 DIAGNOSIS — M25569 Pain in unspecified knee: Secondary | ICD-10-CM | POA: Insufficient documentation

## 2013-05-01 MED ORDER — HYDROCODONE-ACETAMINOPHEN 5-325 MG PO TABS: ORAL_TABLET | ORAL | Status: DC

## 2013-05-01 MED ORDER — IBUPROFEN 600 MG PO TABS: 600.0000 mg | ORAL_TABLET | Freq: Four times a day (QID) | ORAL | Status: DC | PRN

## 2013-05-01 NOTE — ED Notes (Signed)
Per pt about 2 weeks ago was skating and fell on right knee and now having pain.

## 2013-05-01 NOTE — ED Provider Notes (Signed)
Medical screening examination/treatment/procedure(s) were performed by non-physician practitioner and as supervising physician I was immediately available for consultation/collaboration.    Dylanie Quesenberry R Jalexia Lalli, MD 05/01/13 1603 

## 2013-05-01 NOTE — ED Notes (Addendum)
States fell skating 2 weeks ago & c/o continued pain only with walking. Saw MD at [facility] last Tuesday but no X rays were done. No edema noted. States tylenol not helping

## 2013-05-01 NOTE — ED Provider Notes (Signed)
CSN: [id number]     Arrival date & time 05/01/13  1327 History  This chart was scribed for non-physician practitioner Rhea Bleacher PA-C working with Celene Kras, MD by Leone Payor, ED Scribe. This patient was seen in room TR09C/TR09C and the patient's care was started at 1327.      Chief Complaint  Patient presents with  . Knee Pain    The history is provided by the patient. No language interpreter was used.    HPI Comments: Travis Barrera is a 52 y.o. male who presents to the Emergency Department complaining of a fall that occurred 2 weeks ago. Pt states he was roller skating when he fell and landed on both knees. Pt states he was able to stand up with assistance but has had constant, unchanged right knee pain. He states the pain is worse with walking and is eased by rest. Pt is able to ambulate. He denies numbness.   Past Medical History  Diagnosis Date  . Hypertension    History reviewed. No pertinent past surgical history. History reviewed. No pertinent family history. History  Substance Use Topics  . Smoking status: Never Smoker   . Smokeless tobacco: Not on file  . Alcohol Use: Not on file    Review of Systems  Constitutional: Negative for activity change.  Musculoskeletal: Positive for arthralgias (right knee pain ). Negative for back pain, gait problem, joint swelling and neck pain.  Skin: Negative for wound.  Neurological: Negative for weakness and numbness.    Allergies  Sulfonamide derivatives  Home Medications   Current Outpatient Rx  Name  Route  Sig  Dispense  Refill  . cetirizine (ZYRTEC) 10 MG tablet   Oral   Take 10 mg by mouth daily.         Marland Kitchen ibuprofen (ADVIL,MOTRIN) 200 MG tablet   Oral   Take 200 mg by mouth once.         Marland Kitchen lisinopril (PRINIVIL,ZESTRIL) 10 MG tablet   Oral   Take 10 mg by mouth daily.         . pravastatin (PRAVACHOL) 20 MG tablet   Oral   Take 20 mg by mouth daily.         Marland Kitchen HYDROcodone-acetaminophen (NORCO/VICODIN)  5-325 MG per tablet      Take 1-2 tablets every 6 hours as needed for severe pain   8 tablet   0   . ibuprofen (ADVIL,MOTRIN) 600 MG tablet   Oral   Take 1 tablet (600 mg total) by mouth every 6 (six) hours as needed.   20 tablet   0    BP 144/94  Pulse 71  Temp(Src) 98.5 F (36.9 C) (Oral)  Resp 18  Ht 5\' 7"  (1.702 m)  Wt 194 lb (87.998 kg)  BMI 30.38 kg/m2  SpO2 99% Physical Exam  Nursing note and vitals reviewed. Constitutional: He appears well-developed and well-nourished.  HENT:  Head: Normocephalic and atraumatic.  Eyes: Conjunctivae are normal.  Neck: Normal range of motion. Neck supple.  Cardiovascular: Normal rate and normal pulses.   Pulmonary/Chest: Effort normal.  Abdominal: He exhibits no distension.  Musculoskeletal: Normal range of motion. He exhibits tenderness. He exhibits no edema.       Right hip: Normal.       Right knee: He exhibits normal range of motion and no effusion. Tenderness found. Medial joint line tenderness noted.       Right ankle: Normal.  Right knee: Medial joint line tenderness.  Normal ROM. Normal pedal pulse. No effusion.   Neurological: He is alert. No sensory deficit.  Sensation distally intact.   Skin: Skin is warm and dry.  Psychiatric: He has a normal mood and affect.    ED Course  Procedures  DIAGNOSTIC STUDIES: Oxygen Saturation is 99% on RA, normal by my interpretation.    COORDINATION OF CARE: 2:36 PM Discussed negative XRAYs with pt and family. Discussed treatment plan with pt at bedside and pt agreed to plan.    Labs Review Labs Reviewed - No data to display Imaging Review Dg Knee Complete 4 Views Right  05/01/2013   CLINICAL DATA:  Knee pain  EXAM: RIGHT KNEE - COMPLETE 4+ VIEW  COMPARISON:  None.  FINDINGS: There is no evidence of acute fracture nor dislocation. There is subchondral sclerosis. Areas of osteophytosis project along the articular surface of the patella, peripheral borders of the femoral condyles,  periphery of the lateral and medial tibial plateau regions, as well as along the tibial spine regions. Dystrophic calcifications projecting along the expected course of the quadriceps . An incidental note is made of a fabella.  IMPRESSION: 1. Moderate to severe osteoarthritic changes. 2. No evidence of acute osseous abnormalities   Electronically Signed   By: Salome Holmes M.D.   On: 05/01/2013 14:20    EKG Interpretation   None      Vital signs reviewed and are as follows: Filed Vitals:   05/01/13 1330  BP: 144/94  Pulse: 71  Temp: 98.5 F (36.9 C)  Resp: 18   Patient was counseled on RICE protocol and told to rest injury, use ice for no longer than 15 minutes every hour, compress the area, and elevate above the level of their heart as much as possible to reduce swelling.  Questions answered.  Patient verbalized understanding.    Patient counseled on use of narcotic pain medications. Counseled not to combine these medications with others containing tylenol. Urged not to drink alcohol, drive, or perform any other activities that requires focus while taking these medications. The patient verbalizes understanding and agrees with the plan.   MDM   1. Knee pain, acute, left    With continued knee pain. He has significant osteoarthritis however I feel this is more likely due to the contusion he sustained when he fell. Patient is ambulatory at home with a walker. X-rays negative. Orthopedic/PCP followup indicated with pain control, short course anti-inflammatories.  I personally performed the services described in this documentation, which was scribed in my presence. The recorded information has been reviewed and is accurate.    Renne Crigler, PA-C 05/01/13 1558

## 2013-07-27 ENCOUNTER — Encounter (HOSPITAL_COMMUNITY): Payer: Self-pay | Admitting: Emergency Medicine

## 2013-07-27 ENCOUNTER — Emergency Department (HOSPITAL_COMMUNITY)
Admission: EM | Admit: 2013-07-27 | Discharge: 2013-07-27 | Disposition: A | Discharge status: Home / Self Care | Payer: Medicaid Other | Source: Home / Self Care

## 2013-07-27 DIAGNOSIS — H6690 Otitis media, unspecified, unspecified ear: Secondary | ICD-10-CM

## 2013-07-27 DIAGNOSIS — H9209 Otalgia, unspecified ear: Secondary | ICD-10-CM

## 2013-07-27 DIAGNOSIS — H9201 Otalgia, right ear: Secondary | ICD-10-CM

## 2013-07-27 MED ORDER — NEOMYCIN-POLYMYXIN-HC 3.5-10000-1 OT SUSP: 4.0000 [drp] | Freq: Three times a day (TID) | OTIC | Status: AC

## 2013-07-27 MED ORDER — NEOMYCIN-POLYMYXIN-HC 3.5-10000-1 OT SUSP: 4.0000 [drp] | Freq: Three times a day (TID) | OTIC | Status: DC

## 2013-07-27 NOTE — ED Provider Notes (Signed)
CSN: [id number]     Arrival date & time 07/27/13  1458 History   None    Chief Complaint  Patient presents with  . Hearing Problem     (Consider location/radiation/quality/duration/timing/severity/associated sxs/prior Treatment) HPI Comments: Patient presents with a 2 days history of right ear pain, drainage (slight blood tinged) and decreased hearing. No URI symptoms. No fever or chills. No prior history.   The history is provided by the patient and the spouse.    Past Medical History  Diagnosis Date  . Hypertension    History reviewed. No pertinent past surgical history. No family history on file. History  Substance Use Topics  . Smoking status: Never Smoker   . Smokeless tobacco: Not on file  . Alcohol Use: Not on file    Review of Systems  All other systems reviewed and are negative.      Allergies  Sulfonamide derivatives  Home Medications   Current Outpatient Rx  Name  Route  Sig  Dispense  Refill  . cetirizine (ZYRTEC) 10 MG tablet   Oral   Take 10 mg by mouth daily.         Marland Kitchen. HYDROcodone-acetaminophen (NORCO/VICODIN) 5-325 MG per tablet      Take 1-2 tablets every 6 hours as needed for severe pain   8 tablet   0   . ibuprofen (ADVIL,MOTRIN) 200 MG tablet   Oral   Take 200 mg by mouth once.         Marland Kitchen. ibuprofen (ADVIL,MOTRIN) 600 MG tablet   Oral   Take 1 tablet (600 mg total) by mouth every 6 (six) hours as needed.   20 tablet   0   . lisinopril (PRINIVIL,ZESTRIL) 10 MG tablet   Oral   Take 10 mg by mouth daily.         Marland Kitchen. neomycin-polymyxin-hydrocortisone (CORTISPORIN) 3.5-10000-1 otic suspension   Right Ear   Place 4 drops into the right ear 3 (three) times daily.   10 mL   0   . pravastatin (PRAVACHOL) 20 MG tablet   Oral   Take 20 mg by mouth daily.          BP 166/103  Pulse 66  Temp(Src) 99 F (37.2 C) (Oral)  Resp 22  SpO2 100% Physical Exam  Constitutional: He is oriented to person, place, and time. He appears  well-developed and well-nourished. No distress.  HENT:  Head: Normocephalic and atraumatic.  Left Ear: External ear normal.  Nose: Nose normal.  Mouth/Throat: No oropharyngeal exudate.  Right ear canal initially with soft cerumen obstructing view. Post gentle irrigation, noted retracted TM, pus in the canal, and pain with tragus tug  Neck: No tracheal deviation present.  Pulmonary/Chest: Effort normal.  Neurological: He is alert and oriented to person, place, and time. No cranial nerve deficit.  Skin: Skin is warm and dry. He is not diaphoretic.  Psychiatric: His behavior is normal.    ED Course  Procedures (including critical care time) Labs Review Labs Reviewed - No data to display Imaging Review No results found.    MDM   Final diagnoses:  Otitis media  Ear pain, right    AbX drops, tylenol for pain.     Riki SheerMichelle G Young, PA-C 07/27/13 1605

## 2013-07-27 NOTE — Discharge Instructions (Signed)
Ear Drops, Adult You need to put eardrops in your ear. HOME CARE   Put drops in your affected ear as told.  After putting in the drops, lay down with the ear you put the drops in facing up. Do this for 10 minutes. Use the ear drops as long as your doctor tells you.  Before you get up, put a cotton ball gently in your ear. Do not push it far in your ear.  Do not wash out your ears unless your doctor says it is okay.  Finish all medicines as told by your doctor. You may be told to keep using the eardrops even if you start to feel better.  See your doctor as told for follow-up visits. GET HELP IF:  You have pain that gets worse.  Any unusual fluid (drainage) is coming from your ear (especially if the fluid stinks).  You have trouble hearing.  You get really dizzy as if the room is spinning and feel sick to your stomach (vertigo).  The outside of your ear becomes red or puffy or both. This may be a sign of an allergic reaction. MAKE SURE YOU:   Understand these instructions.  Will watch your condition.  Will get help right away if you are not doing well or get worse. Document Released: 11/16/2009 Document Revised: 01/29/2013 Document Reviewed: 12/24/2012 [facility] Patient Information 2014 [place], [place].  Otitis Externa Otitis externa is a bacterial or fungal infection of the outer ear canal. This is the area from the eardrum to the outside of the ear. Otitis externa is sometimes called "swimmer's ear." CAUSES  Possible causes of infection include:  Swimming in dirty water.  Moisture remaining in the ear after swimming or bathing.  Mild injury (trauma) to the ear.  Objects stuck in the ear (foreign body).  Cuts or scrapes (abrasions) on the outside of the ear. SYMPTOMS  The first symptom of infection is often itching in the ear canal. Later signs and symptoms may include swelling and redness of the ear canal, ear pain, and yellowish-white fluid (pus) coming from the  ear. The ear pain may be worse when pulling on the earlobe. DIAGNOSIS  Your caregiver will perform a physical exam. A sample of fluid may be taken from the ear and examined for bacteria or fungi. TREATMENT  Antibiotic ear drops are often given for 10 to 14 days. Treatment may also include pain medicine or corticosteroids to reduce itching and swelling. PREVENTION   Keep your ear dry. Use the corner of a towel to absorb water out of the ear canal after swimming or bathing.  Avoid scratching or putting objects inside your ear. This can damage the ear canal or remove the protective wax that lines the canal. This makes it easier for bacteria and fungi to grow.  Avoid swimming in lakes, polluted water, or poorly chlorinated pools.  You may use ear drops made of rubbing alcohol and vinegar after swimming. Combine equal parts of white vinegar and alcohol in a bottle. Put 3 or 4 drops into each ear after swimming. HOME CARE INSTRUCTIONS   Apply antibiotic ear drops to the ear canal as prescribed by your caregiver.  Only take over-the-counter or prescription medicines for pain, discomfort, or fever as directed by your caregiver.  If you have diabetes, follow any additional treatment instructions from your caregiver.  Keep all follow-up appointments as directed by your caregiver. SEEK MEDICAL CARE IF:   You have a fever.  Your ear is still  red, swollen, painful, or draining pus after 3 days.  Your redness, swelling, or pain gets worse.  You have a severe headache.  You have redness, swelling, pain, or tenderness in the area behind your ear. MAKE SURE YOU:   Understand these instructions.  Will watch your condition.  Will get help right away if you are not doing well or get worse. Document Released: 05/29/2005 Document Revised: 08/21/2011 Document Reviewed: 06/15/2011 [facility] Patient Information 2014 [place]   Be sure to lay on left side with right ear up to place drops.  Stay in this position for 5 minutes to allow absorption. Please f/u with PCP in a few days if not improving.

## 2013-07-27 NOTE — ED Notes (Signed)
Pt  Reports  Decreased  hearing  r  Ear       X  2-3  Days      No  Other  Symptoms  No pain

## 2013-07-27 NOTE — ED Provider Notes (Signed)
Medical screening examination/treatment/procedure(s) were performed by a resident physician or non-physician practitioner and as the supervising physician I was immediately available for consultation/collaboration.  Clementeen GrahamEvan Mkenzie Dotts, MD  Medical screening examination/treatment/procedure(s) were performed by a resident physician or non-physician practitioner and as the supervising physician I was immediately available for consultation/collaboration.  Clementeen GrahamEvan Sabrina Keough, MD    Rodolph BongEvan S Ghina Bittinger, MD 07/27/13 [phone]

## 2014-03-24 ENCOUNTER — Encounter (HOSPITAL_COMMUNITY): Payer: Self-pay | Admitting: Emergency Medicine

## 2014-03-24 ENCOUNTER — Emergency Department (HOSPITAL_COMMUNITY)
Admission: EM | Admit: 2014-03-24 | Discharge: 2014-03-24 | Disposition: A | Discharge status: Home / Self Care | Payer: Medicaid Other | Attending: Emergency Medicine | Admitting: Emergency Medicine

## 2014-03-24 DIAGNOSIS — Z79899 Other long term (current) drug therapy: Secondary | ICD-10-CM | POA: Insufficient documentation

## 2014-03-24 DIAGNOSIS — R05 Cough: Secondary | ICD-10-CM | POA: Diagnosis present

## 2014-03-24 DIAGNOSIS — I10 Essential (primary) hypertension: Secondary | ICD-10-CM | POA: Diagnosis not present

## 2014-03-24 DIAGNOSIS — J069 Acute upper respiratory infection, unspecified: Secondary | ICD-10-CM | POA: Diagnosis not present

## 2014-03-24 MED ORDER — GUAIFENESIN 100 MG/5ML PO LIQD: 100.0000 mg | ORAL | Status: DC | PRN

## 2014-03-24 MED ORDER — BENZONATATE 100 MG PO CAPS: 100.0000 mg | ORAL_CAPSULE | Freq: Three times a day (TID) | ORAL | Status: DC

## 2014-03-24 MED ORDER — HYDROCODONE-ACETAMINOPHEN 7.5-325 MG/15ML PO SOLN: 15.0000 mL | Freq: Every evening | ORAL | Status: DC | PRN

## 2014-03-24 NOTE — Discharge Instructions (Signed)
Call for a follow up appointment with a Family or Primary Care Provider.  Return if Symptoms worsen.   Take medication as prescribed.  Drink plenty of fluids. Salt water gargles 3-4 times a day. Do not operate heavy machinery or drink alcohol while taking narcotic pain medication.

## 2014-03-24 NOTE — ED Provider Notes (Signed)
CSN: [id number]     Arrival date & time 03/24/14  1558 History   This chart was scribed for non-physician practitioner Mellody DrownLauren Vinisha Faxon, PA-C, working with Flint MelterElliott L Wentz, MD, by Yevette EdwardsAngela Bracken, ED Scribe. This patient was seen in room TR07C/TR07C and the patient's care was started at 5:00 PM.  First MD Initiated Contact with Patient 03/24/14 1624     Chief Complaint  Patient presents with  . Nasal Congestion  . Cough    HPI Comments: Travis Barrera is a 53 y.o. male who presents to the Emergency Department complaining of a nonproductive cough which has persisted for 3-4 days and which has been associated with nasal congestion and rhinnorhea. He also endorses posttussis chest discomfort. He denies a fever, sinus pressure, and otalgia. He denies any OTC medication. He reports a h/o similar symptoms associated with seasonal allergies. He denies sick contacts.  His PCP is with [facility].  The history is provided by the patient. No language interpreter was used.    Past Medical History  Diagnosis Date  . Hypertension    History reviewed. No pertinent past surgical history. No family history on file. History  Substance Use Topics  . Smoking status: Never Smoker   . Smokeless tobacco: Not on file  . Alcohol Use: Not on file    Review of Systems  Constitutional: Negative for fever and chills.  HENT: Positive for congestion, rhinorrhea and sore throat. Negative for ear pain and sinus pressure.   Respiratory: Positive for cough.   All other systems reviewed and are negative.   Allergies  Sulfonamide derivatives  Home Medications   Prior to Admission medications   Medication Sig Start Date End Date Taking? Authorizing Provider  cetirizine (ZYRTEC) 10 MG tablet Take 10 mg by mouth daily.    Historical Provider, MD  HYDROcodone-acetaminophen (NORCO/VICODIN) 5-325 MG per tablet Take 1-2 tablets every 6 hours as needed for severe pain 05/01/13   Renne CriglerJoshua Geiple, PA-C  ibuprofen  (ADVIL,MOTRIN) 200 MG tablet Take 200 mg by mouth once.    Historical Provider, MD  ibuprofen (ADVIL,MOTRIN) 600 MG tablet Take 1 tablet (600 mg total) by mouth every 6 (six) hours as needed. 05/01/13   Renne CriglerJoshua Geiple, PA-C  lisinopril (PRINIVIL,ZESTRIL) 10 MG tablet Take 10 mg by mouth daily.    Historical Provider, MD  neomycin-polymyxin-hydrocortisone (CORTISPORIN) 3.5-10000-1 otic suspension Place 4 drops into the right ear 3 (three) times daily. 07/27/13   Riki SheerMichelle G Young, PA-C  pravastatin (PRAVACHOL) 20 MG tablet Take 20 mg by mouth daily.    Historical Provider, MD   Triage Vitals: BP 176/97  Pulse 58  Temp(Src) 98.1 F (36.7 C) (Oral)  Resp 20  Ht 5\' 4"  (1.626 m)  Wt 196 lb (88.905 kg)  BMI 33.63 kg/m2  SpO2 98%  Physical Exam  Nursing note and vitals reviewed. Constitutional: He is oriented to person, place, and time. He appears well-developed and well-nourished. No distress.  HENT:  Head: Normocephalic and atraumatic.  Right Ear: Tympanic membrane is scarred. Tympanic membrane is not retracted and not bulging.  Left Ear: Tympanic membrane is scarred. Tympanic membrane is not retracted and not bulging.  Nose: Rhinorrhea present. Right sinus exhibits no maxillary sinus tenderness and no frontal sinus tenderness. Left sinus exhibits no maxillary sinus tenderness and no frontal sinus tenderness.  Mouth/Throat: Uvula is midline. Posterior oropharyngeal erythema present. No oropharyngeal exudate.  Eyes: Conjunctivae and EOM are normal.  Neck: Neck supple.  Cardiovascular: Normal rate and regular rhythm.  Pulmonary/Chest: Effort normal. No respiratory distress. He has no wheezes. He has no rales.  Musculoskeletal: Normal range of motion.  Lymphadenopathy:       Head (right side): No submental, no submandibular, no tonsillar, no preauricular and no occipital adenopathy present.       Head (left side): No submental, no submandibular, no tonsillar, no preauricular and no occipital  adenopathy present.    He has no cervical adenopathy.  Neurological: He is alert and oriented to person, place, and time.  Skin: Skin is warm and dry. He is not diaphoretic.  Psychiatric: He has a normal mood and affect. His behavior is normal.    ED Course  Procedures (including critical care time)   COORDINATION OF CARE:  5:04 PM- Discussed treatment plan with patient, and the patient agreed to the plan. Encouraged increase of fluid consumption. Also encouraged salt-water gargles, honey, and OTC medication.   Labs Review Labs Reviewed - No data to display  Imaging Review No results found.   EKG Interpretation None      MDM   Final diagnoses:  URI, acute  Patients symptoms are consistent with URI, likely viral etiology. Discussed that antibiotics are not indicated for viral infections. Pt will be discharged with symptomatic treatment.  Verbalizes understanding and is agreeable with plan. Pt is hemodynamically stable & in NAD prior to dc. Meds given in ED:  Medications - No data to display  Discharge Medication List as of 03/24/2014  5:15 PM    START taking these medications   Details  benzonatate (TESSALON) 100 MG capsule Take 1 capsule (100 mg total) by mouth every 8 (eight) hours., Starting 03/24/2014, Until Discontinued, Print    guaiFENesin (ROBITUSSIN) 100 MG/5ML liquid Take 5-10 mLs (100-200 mg total) by mouth every 4 (four) hours as needed for cough., Starting 03/24/2014, Until Discontinued, Print    HYDROcodone-acetaminophen (HYCET) 7.5-325 mg/15 ml solution Take 15 mLs by mouth at bedtime as needed for moderate pain or severe pain., Starting 03/24/2014, Until Discontinued, Print       I personally performed the services described in this documentation, which was scribed in my presence. The recorded information has been reviewed and is accurate.     Mellody DrownLauren Steffan Caniglia, PA-C 03/25/14 [phone]

## 2014-03-24 NOTE — ED Notes (Signed)
Pt has been having some nasal congestion and nonproductive cough for the past 3-4 days. His chest hurts when he coughs.

## 2014-03-25 NOTE — ED Provider Notes (Signed)
Medical screening examination/treatment/procedure(s) were performed by non-physician practitioner and as supervising physician I was immediately available for consultation/collaboration.   EKG Interpretation None       Flint MelterElliott L Deborha Moseley, MD 03/25/14 [phone]

## 2014-04-03 ENCOUNTER — Encounter (HOSPITAL_COMMUNITY): Payer: Self-pay | Admitting: Emergency Medicine

## 2014-04-03 ENCOUNTER — Emergency Department (HOSPITAL_COMMUNITY)
Admission: EM | Admit: 2014-04-03 | Discharge: 2014-04-03 | Disposition: A | Discharge status: Home / Self Care | Payer: Medicaid Other | Attending: Emergency Medicine | Admitting: Emergency Medicine

## 2014-04-03 DIAGNOSIS — I10 Essential (primary) hypertension: Secondary | ICD-10-CM | POA: Diagnosis present

## 2014-04-03 DIAGNOSIS — Z79899 Other long term (current) drug therapy: Secondary | ICD-10-CM | POA: Insufficient documentation

## 2014-04-03 DIAGNOSIS — Z7982 Long term (current) use of aspirin: Secondary | ICD-10-CM | POA: Insufficient documentation

## 2014-04-03 LAB — COMPREHENSIVE METABOLIC PANEL
ALBUMIN: 4.3 g/dL (ref 3.5–5.2)
ALT: 22 U/L (ref 0–53)
AST: 27 U/L (ref 0–37)
Alkaline Phosphatase: 71 U/L (ref 39–117)
Anion gap: 15 (ref 5–15)
BUN: 10 mg/dL (ref 6–23)
CO2: 24 mEq/L (ref 19–32)
Calcium: 9.3 mg/dL (ref 8.4–10.5)
Chloride: 101 mEq/L (ref 96–112)
Creatinine, Ser: 1.28 mg/dL (ref 0.50–1.35)
GFR calc Af Amer: 72 mL/min — ABNORMAL LOW (ref >90)
GFR, EST NON AFRICAN AMERICAN: 62 mL/min — AB (ref >90)
Glucose, Bld: 83 mg/dL (ref 70–99)
Potassium: 3.9 mEq/L (ref 3.7–5.3)
Sodium: 140 mEq/L (ref 137–147)
Total Bilirubin: 0.5 mg/dL (ref 0.3–1.2)
Total Protein: 7.8 g/dL (ref 6.0–8.3)

## 2014-04-03 LAB — CBC
HCT: 38.2 % — ABNORMAL LOW (ref 39.0–52.0)
HEMOGLOBIN: 13.1 g/dL (ref 13.0–17.0)
MCH: 34.5 pg — ABNORMAL HIGH (ref 26.0–34.0)
MCHC: 34.3 g/dL (ref 30.0–36.0)
MCV: 100.5 fL — ABNORMAL HIGH (ref 78.0–100.0)
Platelets: 425 10*3/uL — ABNORMAL HIGH (ref 150–400)
RBC: 3.8 MIL/uL — ABNORMAL LOW (ref 4.22–5.81)
RDW: 12.2 % (ref 11.5–15.5)
WBC: 5.3 10*3/uL (ref 4.0–10.5)

## 2014-04-03 LAB — I-STAT TROPONIN, ED: TROPONIN I, POC: 0.02 ng/mL (ref 0.00–0.08)

## 2014-04-03 NOTE — ED Notes (Signed)
Pt to ED via [facility] from [facility] c/o hypertension. Pt with hx of htn. Staff gave 0.2mg  clonidine at 1630 for hypertension. BP 180/130 initially. On arrival pt SB in 7850s. EKG for EMS showed depression. Dr.Nanavati reviewed ekg on arrival

## 2014-04-03 NOTE — ED Notes (Signed)
Dr. Nanavati at bedside 

## 2014-04-03 NOTE — Discharge Instructions (Signed)
We saw you in the ER for the elevated blood pressure. All of our cardiac workup is normal, including labs, EKG. Please take the BP meds your doctor prescribed and see them again next week.   Hypertension Hypertension, commonly called high blood pressure, is when the force of blood pumping through your arteries is too strong. Your arteries are the blood vessels that carry blood from your heart throughout your body. A blood pressure reading consists of a higher number over a lower number, such as 110/72. The higher number (systolic) is the pressure inside your arteries when your heart pumps. The lower number (diastolic) is the pressure inside your arteries when your heart relaxes. Ideally you want your blood pressure below 120/80. Hypertension forces your heart to work harder to pump blood. Your arteries may become narrow or stiff. Having hypertension puts you at risk for heart disease, stroke, and other problems.  RISK FACTORS Some risk factors for high blood pressure are controllable. Others are not.  Risk factors you cannot control include:   Race. You may be at higher risk if you are African American.  Age. Risk increases with age.  Gender. Men are at higher risk than women before age 54 years. After age 54, women are at higher risk than men. Risk factors you can control include:  Not getting enough exercise or physical activity.  Being overweight.  Getting too much fat, sugar, calories, or salt in your diet.  Drinking too much alcohol. SIGNS AND SYMPTOMS Hypertension does not usually cause signs or symptoms. Extremely high blood pressure (hypertensive crisis) may cause headache, anxiety, shortness of breath, and nosebleed. DIAGNOSIS  To check if you have hypertension, your health care provider will measure your blood pressure while you are seated, with your arm held at the level of your heart. It should be measured at least twice using the same arm. Certain conditions can cause a  difference in blood pressure between your right and left arms. A blood pressure reading that is higher than normal on one occasion does not mean that you need treatment. If one blood pressure reading is high, ask your health care provider about having it checked again. TREATMENT  Treating high blood pressure includes making lifestyle changes and possibly taking medicine. Living a healthy lifestyle can help lower high blood pressure. You may need to change some of your habits. Lifestyle changes may include:  Following the DASH diet. This diet is high in fruits, vegetables, and whole grains. It is low in salt, red meat, and added sugars.  Getting at least 2 hours of brisk physical activity every week.  Losing weight if necessary.  Not smoking.  Limiting alcoholic beverages.  Learning ways to reduce stress. If lifestyle changes are not enough to get your blood pressure under control, your health care provider may prescribe medicine. You may need to take more than one. Work closely with your health care provider to understand the risks and benefits. HOME CARE INSTRUCTIONS  Have your blood pressure rechecked as directed by your health care provider.   Take medicines only as directed by your health care provider. Follow the directions carefully. Blood pressure medicines must be taken as prescribed. The medicine does not work as well when you skip doses. Skipping doses also puts you at risk for problems.   Do not smoke.   Monitor your blood pressure at home as directed by your health care provider. SEEK MEDICAL CARE IF:   You think you are having a reaction to  medicines taken.  You have recurrent headaches or feel dizzy.  You have swelling in your ankles.  You have trouble with your vision. SEEK IMMEDIATE MEDICAL CARE IF:  You develop a severe headache or confusion.  You have unusual weakness, numbness, or feel faint.  You have severe chest or abdominal pain.  You vomit  repeatedly.  You have trouble breathing. MAKE SURE YOU:   Understand these instructions.  Will watch your condition.  Will get help right away if you are not doing well or get worse. Document Released: 05/29/2005 Document Revised: 10/13/2013 Document Reviewed: 03/21/2013 [facility] Patient Information 2015 [place], [place]. This information is not intended to replace advice given to you by your health care provider. Make sure you discuss any questions you have with your health care provider.

## 2014-04-03 NOTE — ED Provider Notes (Signed)
CSN: [id number]     Arrival date & time 04/03/14  1752 History   First MD Initiated Contact with Patient 04/03/14 1801     Chief Complaint  Patient presents with  . Hypertension     (Consider location/radiation/quality/duration/timing/severity/associated sxs/prior Treatment) HPI Comments: Pt has MR, mother assisted with the hx. Pt has hx of HTN. Went to PCP today, and his BP was >200 SBP, thus patient was given 2 BP meds, one of which was clonidine, and sent to the ER. Pt denies chest pain, dib, nausea, visual complains, headaches. Mother agrees  Patient is a 53 y.o. male presenting with hypertension. The history is provided by the patient and a parent.  Hypertension Pertinent negatives include no chest pain, no abdominal pain and no shortness of breath.    Past Medical History  Diagnosis Date  . Hypertension    Past Surgical History  Procedure Laterality Date  . Appendectomy     History reviewed. No pertinent family history. History  Substance Use Topics  . Smoking status: Never Smoker   . Smokeless tobacco: Not on file  . Alcohol Use: No    Review of Systems  Constitutional: Negative for activity change and appetite change.  Respiratory: Negative for cough and shortness of breath.   Cardiovascular: Negative for chest pain.  Gastrointestinal: Negative for abdominal pain.  Genitourinary: Negative for dysuria.      Allergies  Sulfonamide derivatives  Home Medications   Prior to Admission medications   Medication Sig Start Date End Date Taking? Authorizing Provider  aspirin EC 81 MG tablet Take 81 mg by mouth daily.   Yes Historical Provider, MD  benzonatate (TESSALON) 100 MG capsule Take 100 mg by mouth 3 (three) times daily as needed for cough.   Yes Historical Provider, MD  cetirizine (ZYRTEC) 10 MG tablet Take 10 mg by mouth daily.   Yes Historical Provider, MD  guaifenesin (ROBITUSSIN) 100 MG/5ML syrup Take 100-200 mg by mouth every 4 (four) hours as needed  for cough or congestion.   Yes Historical Provider, MD  HYDROcodone-acetaminophen (HYCET) 7.5-325 mg/15 ml solution Take 15 mLs by mouth at bedtime as needed for moderate pain or severe pain.   Yes Historical Provider, MD  lisinopril (PRINIVIL,ZESTRIL) 10 MG tablet Take 10 mg by mouth every evening.    Yes Historical Provider, MD  pravastatin (PRAVACHOL) 20 MG tablet Take 20 mg by mouth daily.   Yes Historical Provider, MD   BP 139/93  Pulse 50  Temp(Src) 98.8 F (37.1 C) (Oral)  Resp 20  SpO2 98% Physical Exam  Nursing note and vitals reviewed. Constitutional: He is oriented to person, place, and time. He appears well-developed.  HENT:  Head: Normocephalic and atraumatic.  Eyes: Conjunctivae and EOM are normal. Pupils are equal, round, and reactive to light.  Neck: Normal range of motion. Neck supple.  Cardiovascular: Normal rate and regular rhythm.   Pulmonary/Chest: Effort normal and breath sounds normal.  Abdominal: Soft. Bowel sounds are normal. He exhibits no distension. There is no tenderness. There is no rebound and no guarding.  Neurological: He is alert and oriented to person, place, and time.  Skin: Skin is warm.    ED Course  Procedures (including critical care time) Labs Review Labs Reviewed  CBC - Abnormal; Notable for the following:    RBC 3.80 (*)    HCT 38.2 (*)    MCV 100.5 (*)    MCH 34.5 (*)    Platelets 425 (*)  All other components within normal limits  COMPREHENSIVE METABOLIC PANEL - Abnormal; Notable for the following:    GFR calc non Af Amer 62 (*)    GFR calc Af Amer 72 (*)    All other components within normal limits  I-STAT TROPOININ, ED    Imaging Review No results found.   EKG Interpretation   Date/Time:  Friday April 03 2014 17:56:57 EDT Ventricular Rate:  52 PR Interval:  199 QRS Duration: 91 QT Interval:  429 QTC Calculation: 399 R Axis:   -2 Text Interpretation:  Sinus rhythm Left ventricular hypertrophy Abnormal  T,  consider ischemia, diffuse leads ST elevation, consider anterolateral  injury ST changes appear to be early repolarization Confirmed by Rhunette CroftNANAVATI,  MD, Janey GentaANKIT [phone]([id number]) on 04/03/2014 6:13:39 PM      MDM   Final diagnoses:  Essential hypertension    Pt comes in w. Elevated BP. Asymptomatic. Pt was given some BP meds, and his BP is a lot better right now - 150s/90s. Pt was also given a prescription by the PCP prior to sending him over to the ER. We did get basic labs, and with normal labs, and no symptoms of end organ damage, we will d/c pt. Pt has MR, so mother helped with the hx and understands the plan.  Derwood KaplanAnkit Cathey [place], MD 04/05/14 0120

## 2014-04-03 NOTE — ED Notes (Signed)
Per mother: pt has been having ongoing issues controlling blood pressure at home. Mother reports blood pressures have been "over 200 on some night." Pt denies any complaints. Pt was seen at [facility] told for hypertension. Pt sent to ED for further eval. EMS completed multiple EKGs enroute including posterior EKG showing depression in multiple leads. Pt received 0.2mg  clonidine at office. Pt also received prescription for carvedilol. SB showing on cardiac monitor.

## 2014-04-05 ENCOUNTER — Emergency Department (HOSPITAL_COMMUNITY)
Admission: EM | Admit: 2014-04-05 | Discharge: 2014-04-05 | Disposition: A | Discharge status: Home / Self Care | Payer: Medicaid Other | Attending: Emergency Medicine | Admitting: Emergency Medicine

## 2014-04-05 ENCOUNTER — Encounter (HOSPITAL_COMMUNITY): Payer: Self-pay | Admitting: Emergency Medicine

## 2014-04-05 DIAGNOSIS — Z79899 Other long term (current) drug therapy: Secondary | ICD-10-CM | POA: Insufficient documentation

## 2014-04-05 DIAGNOSIS — Z7982 Long term (current) use of aspirin: Secondary | ICD-10-CM | POA: Diagnosis not present

## 2014-04-05 DIAGNOSIS — Z791 Long term (current) use of non-steroidal anti-inflammatories (NSAID): Secondary | ICD-10-CM | POA: Diagnosis not present

## 2014-04-05 DIAGNOSIS — I10 Essential (primary) hypertension: Secondary | ICD-10-CM | POA: Diagnosis not present

## 2014-04-05 MED ORDER — CARVEDILOL 6.25 MG PO TABS
6.2500 mg | ORAL_TABLET | Freq: Two times a day (BID) | ORAL | Status: DC
  Administered 2014-04-05: 6.25 mg via ORAL
  Filled 2014-04-05 (×2): qty 1

## 2014-04-05 NOTE — ED Notes (Signed)
Waiting for Coreg tablet to come from pharmacy.

## 2014-04-05 NOTE — ED Provider Notes (Signed)
Medical screening examination/treatment/procedure(s) were performed by non-physician practitioner and as supervising physician I was immediately available for consultation/collaboration.   EKG Interpretation None        Gwyneth SproutWhitney Lashell Moffitt, MD 04/05/14 2134

## 2014-04-05 NOTE — ED Provider Notes (Signed)
CSN: [id number]     Arrival date & time 04/05/14  0900 History   First MD Initiated Contact with Patient 04/05/14 0920     Chief Complaint  Patient presents with  . Hypertension     (Consider location/radiation/quality/duration/timing/severity/associated sxs/prior Treatment) HPI Travis Barrera is a 53 year old male with past medical history of hypertension who presents to the ER with complaint of hypertension. Patient is MR and his mother provides history for him. Patient mother reports that patient was seen in the ER 2 days ago after having an elevated blood pressure and being out of his hypertension medications, and prescribed carvedilol by his primary care provider. Patient's mother reports that they were unable to get the prescription filled, and this morning patient's systolic blood pressure was 270, so they return to the ER. Patient has now been out of his blood pressure medication for approximately one week. Patient takes lisinopril and Norvasc on a regular basis. Patient denies having any headache, dizziness, blurred vision, syncope, lightheadedness, chest pain, shortness of breath, abdominal pain, nausea, vomiting, dysuria.  Past Medical History  Diagnosis Date  . Hypertension    Past Surgical History  Procedure Laterality Date  . Appendectomy     History reviewed. No pertinent family history. History  Substance Use Topics  . Smoking status: Never Smoker   . Smokeless tobacco: Not on file  . Alcohol Use: No    Review of Systems  Constitutional: Negative for fever.  HENT: Negative for trouble swallowing.   Eyes: Negative for visual disturbance.  Respiratory: Negative for shortness of breath.   Cardiovascular: Negative for chest pain.  Gastrointestinal: Negative for nausea, vomiting and abdominal pain.  Genitourinary: Negative for dysuria.  Musculoskeletal: Negative for neck pain.  Skin: Negative for rash.  Neurological: Negative for dizziness, weakness and numbness.   Psychiatric/Behavioral: Negative.       Allergies  Sulfonamide derivatives  Home Medications   Prior to Admission medications   Medication Sig Start Date End Date Taking? Authorizing Provider  amLODipine (NORVASC) 10 MG tablet Take 10 mg by mouth daily.   Yes Historical Provider, MD  aspirin 325 MG tablet Take 325 mg by mouth daily. For arthritis pain   Yes Historical Provider, MD  cetirizine (ZYRTEC) 10 MG tablet Take 10 mg by mouth daily.   Yes Historical Provider, MD  guaifenesin (ROBITUSSIN) 100 MG/5ML syrup Take 100-200 mg by mouth every 4 (four) hours as needed for cough or congestion.   Yes Historical Provider, MD  HYDROcodone-acetaminophen (HYCET) 7.5-325 mg/15 ml solution Take 15 mLs by mouth at bedtime as needed for moderate pain or severe pain.   Yes Historical Provider, MD  lisinopril (PRINIVIL,ZESTRIL) 40 MG tablet Take 40 mg by mouth daily.   Yes Historical Provider, MD  meloxicam (MOBIC) 7.5 MG tablet Take 7.5 mg by mouth daily.   Yes Historical Provider, MD  pravastatin (PRAVACHOL) 20 MG tablet Take 20 mg by mouth daily.   Yes Historical Provider, MD   BP 182/101  Pulse 50  Resp 26  Ht 5\' 4"  (1.626 m)  Wt 196 lb (88.905 kg)  BMI 33.63 kg/m2  SpO2 100% Physical Exam  Nursing note and vitals reviewed. Constitutional: He is oriented to person, place, and time. He appears well-developed and well-nourished. No distress.  HENT:  Head: Normocephalic and atraumatic.  Mouth/Throat: Oropharynx is clear and moist. No oropharyngeal exudate.  Eyes: EOM are normal. Pupils are equal, round, and reactive to light. Right eye exhibits no discharge. Left eye exhibits no  discharge. No scleral icterus.  Neck: Normal range of motion.  Cardiovascular: Normal rate, regular rhythm and normal heart sounds.   No murmur heard. Pulmonary/Chest: Effort normal and breath sounds normal. No respiratory distress.  Abdominal: Soft. There is no tenderness.  Musculoskeletal: Normal range of  motion. He exhibits no edema and no tenderness.  Neurological: He is alert and oriented to person, place, and time. He has normal strength. No cranial nerve deficit or sensory deficit. Coordination normal.  Motor strength 5 out of 5 in all major muscle groups of upper and lower extremities. Distal sensation intact.  Skin: Skin is warm and dry. No rash noted. He is not diaphoretic.  Psychiatric: He has a normal mood and affect.    ED Course  Procedures (including critical care time) Labs Review Labs Reviewed - No data to display  Imaging Review No results found.   EKG Interpretation None      MDM   Final diagnoses:  Essential hypertension    53 year old male returning with hypertension secondary to noncompliance with medication. Patient's mother reporting that they were unable to fill the medication due to the pharmacy being closed. Patient asymptomatic, 180s to 200 over 90s to 110 BP in the ER. Recent workup with basic labs 2 days ago, which we will not repeat today since there has been no change in patient's condition and patient remains asymptomatic with an isolated hypertension. Will provide patient with his prescribed carvedilol dose today, and discharge to allow him to fill his prescription at the drugstore. I strongly encouraged patient to follow up with his primary care provider, and discussed return precautions with him. I encouraged patient to call or return to the ER should he develop any symptoms, or should his condition persist or should he have any questions or concerns.  BP 182/101  Pulse 50  Resp 26  Ht 5\' 4"  (1.626 m)  Wt 196 lb (88.905 kg)  BMI 33.63 kg/m2  SpO2 100%  Signed,  Ladona MowJoe Kalil Woessner, PA-C 5:40 PM  This patient discussed with Dr. Gwyneth SproutWhitney Plunkett, M.D.  Monte FantasiaJoseph W Hardeep Reetz, PA-C 04/05/14 1740

## 2014-04-05 NOTE — Discharge Instructions (Signed)
Continue taking your prescribed regimen of blood pressure medicine prescribed by your primary care doctor. Follow up with your primary care doctor within 1 week. Return to the ER if she develops any dizziness, weakness, loss of consciousness, blurred vision, headache, chest pain, shortness of breath, nausea, vomiting, trouble urinating.  Hypertension Hypertension, commonly called high blood pressure, is when the force of blood pumping through your arteries is too strong. Your arteries are the blood vessels that carry blood from your heart throughout your body. A blood pressure reading consists of a higher number over a lower number, such as 110/72. The higher number (systolic) is the pressure inside your arteries when your heart pumps. The lower number (diastolic) is the pressure inside your arteries when your heart relaxes. Ideally you want your blood pressure below 120/80. Hypertension forces your heart to work harder to pump blood. Your arteries may become narrow or stiff. Having hypertension puts you at risk for heart disease, stroke, and other problems.  RISK FACTORS Some risk factors for high blood pressure are controllable. Others are not.  Risk factors you cannot control include:   Race. You may be at higher risk if you are African American.  Age. Risk increases with age.  Gender. Men are at higher risk than women before age 15 years. After age 79, women are at higher risk than men. Risk factors you can control include:  Not getting enough exercise or physical activity.  Being overweight.  Getting too much fat, sugar, calories, or salt in your diet.  Drinking too much alcohol. SIGNS AND SYMPTOMS Hypertension does not usually cause signs or symptoms. Extremely high blood pressure (hypertensive crisis) may cause headache, anxiety, shortness of breath, and nosebleed. DIAGNOSIS  To check if you have hypertension, your health care provider will measure your blood pressure while you are  seated, with your arm held at the level of your heart. It should be measured at least twice using the same arm. Certain conditions can cause a difference in blood pressure between your right and left arms. A blood pressure reading that is higher than normal on one occasion does not mean that you need treatment. If one blood pressure reading is high, ask your health care provider about having it checked again. TREATMENT  Treating high blood pressure includes making lifestyle changes and possibly taking medicine. Living a healthy lifestyle can help lower high blood pressure. You may need to change some of your habits. Lifestyle changes may include:  Following the DASH diet. This diet is high in fruits, vegetables, and whole grains. It is low in salt, red meat, and added sugars.  Getting at least 2 hours of brisk physical activity every week.  Losing weight if necessary.  Not smoking.  Limiting alcoholic beverages.  Learning ways to reduce stress. If lifestyle changes are not enough to get your blood pressure under control, your health care provider may prescribe medicine. You may need to take more than one. Work closely with your health care provider to understand the risks and benefits. HOME CARE INSTRUCTIONS  Have your blood pressure rechecked as directed by your health care provider.   Take medicines only as directed by your health care provider. Follow the directions carefully. Blood pressure medicines must be taken as prescribed. The medicine does not work as well when you skip doses. Skipping doses also puts you at risk for problems.   Do not smoke.   Monitor your blood pressure at home as directed by your health care provider.  SEEK MEDICAL CARE IF:   You think you are having a reaction to medicines taken.  You have recurrent headaches or feel dizzy.  You have swelling in your ankles.  You have trouble with your vision. SEEK IMMEDIATE MEDICAL CARE IF:  You develop a  severe headache or confusion.  You have unusual weakness, numbness, or feel faint.  You have severe chest or abdominal pain.  You vomit repeatedly.  You have trouble breathing. MAKE SURE YOU:   Understand these instructions.  Will watch your condition.  Will get help right away if you are not doing well or get worse. Document Released: 05/29/2005 Document Revised: 10/13/2013 Document Reviewed: 03/21/2013 [facility] Patient Information 2015 [employer], [place]. This information is not intended to replace advice given to you by your health care provider. Make sure you discuss any questions you have with your health care provider.   DASH Eating Plan DASH stands for "Dietary Approaches to Stop Hypertension." The DASH eating plan is a healthy eating plan that has been shown to reduce high blood pressure (hypertension). Additional health benefits may include reducing the risk of type 2 diabetes mellitus, heart disease, and stroke. The DASH eating plan may also help with weight loss. WHAT DO I NEED TO KNOW ABOUT THE DASH EATING PLAN? For the DASH eating plan, you will follow these general guidelines:  Choose foods with a percent daily value for sodium of less than 5% (as listed on the food label).  Use salt-free seasonings or herbs instead of table salt or sea salt.  Check with your health care provider or pharmacist before using salt substitutes.  Eat lower-sodium products, often labeled as "lower sodium" or "no salt added."  Eat fresh foods.  Eat more vegetables, fruits, and low-fat dairy products.  Choose whole grains. Look for the word "whole" as the first word in the ingredient list.  Choose fish and skinless chicken or [place]key more often than red meat. Limit fish, poultry, and meat to 6 oz (170 g) each day.  Limit sweets, desserts, sugars, and sugary drinks.  Choose heart-healthy fats.  Limit cheese to 1 oz (28 g) per day.  Eat more home-cooked food and less restaurant, buffet,  and fast food.  Limit fried foods.  Cook foods using methods other than frying.  Limit canned vegetables. If you do use them, rinse them well to decrease the sodium.  When eating at a restaurant, ask that your food be prepared with less salt, or no salt if possible. WHAT FOODS CAN I EAT? Seek help from a dietitian for individual calorie needs. Grains Whole grain or whole wheat bread. Brown rice. Whole grain or whole wheat pasta. Quinoa, bulgur, and whole grain cereals. Low-sodium cereals. Corn or whole wheat flour tortillas. Whole grain cornbread. Whole grain crackers. Low-sodium crackers. Vegetables Fresh or frozen vegetables (raw, steamed, roasted, or grilled). Low-sodium or reduced-sodium tomato and vegetable juices. Low-sodium or reduced-sodium tomato sauce and paste. Low-sodium or reduced-sodium canned vegetables.  Fruits All fresh, canned (in natural juice), or frozen fruits. Meat and Other Protein Products Ground beef (85% or leaner), grass-fed beef, or beef trimmed of fat. Skinless chicken or [place]key. Ground chicken or [place]key. Pork trimmed of fat. All fish and seafood. Eggs. Dried beans, peas, or lentils. Unsalted nuts and seeds. Unsalted canned beans. Dairy Low-fat dairy products, such as skim or 1% milk, 2% or reduced-fat cheeses, low-fat ricotta or cottage cheese, or plain low-fat yogurt. Low-sodium or reduced-sodium cheeses. Fats and Oils Tub margarines without trans fats.  Light or reduced-fat mayonnaise and salad dressings (reduced sodium). Avocado. Safflower, olive, or canola oils. Natural peanut or almond butter. Other Unsalted popcorn and pretzels. The items listed above may not be a complete list of recommended foods or beverages. Contact your dietitian for more options. WHAT FOODS ARE NOT RECOMMENDED? Grains White bread. White pasta. White rice. Refined cornbread. Bagels and croissants. Crackers that contain trans fat. Vegetables Creamed or fried vegetables. Vegetables  in a cheese sauce. Regular canned vegetables. Regular canned tomato sauce and paste. Regular tomato and vegetable juices. Fruits Dried fruits. Canned fruit in light or heavy syrup. Fruit juice. Meat and Other Protein Products Fatty cuts of meat. Ribs, chicken wings, bacon, sausage, bologna, salami, chitterlings, fatback, hot dogs, bratwurst, and packaged luncheon meats. Salted nuts and seeds. Canned beans with salt. Dairy Whole or 2% milk, cream, half-and-half, and cream cheese. Whole-fat or sweetened yogurt. Full-fat cheeses or blue cheese. Nondairy creamers and whipped toppings. Processed cheese, cheese spreads, or cheese curds. Condiments Onion and garlic salt, seasoned salt, table salt, and sea salt. Canned and packaged gravies. Worcestershire sauce. Tartar sauce. Barbecue sauce. Teriyaki sauce. Soy sauce, including reduced sodium. Steak sauce. Fish sauce. Oyster sauce. Cocktail sauce. Horseradish. Ketchup and mustard. Meat flavorings and tenderizers. Bouillon cubes. Hot sauce. Tabasco sauce. Marinades. Taco seasonings. Relishes. Fats and Oils Butter, stick margarine, lard, shortening, ghee, and bacon fat. Coconut, palm kernel, or palm oils. Regular salad dressings. Other Pickles and olives. Salted popcorn and pretzels. The items listed above may not be a complete list of foods and beverages to avoid. Contact your dietitian for more information. WHERE CAN I FIND MORE INFORMATION? National Heart, Lung, and Blood Institute: CablePromo.itwww.nhlbi.nih.gov/health/health-topics/topics/dash/ Document Released: 05/18/2011 Document Revised: 10/13/2013 Document Reviewed: 04/02/2013 [facility] Patient Information 2015 [place]Care, [place]. This information is not intended to replace advice given to you by your health care provider. Make sure you discuss any questions you have with your health care provider.

## 2014-04-05 NOTE — ED Notes (Signed)
Pt alert and oriented x4. Pt denies headache or any pain along with this high blood pressure. Pt resting comfortably with mother in room. Mother very talkative and speaking for the pt. Encouraging pt to speak more.

## 2014-04-05 NOTE — ED Notes (Signed)
Pt was here Friday for high blood pressure. Prescription was written and pt states he was not able to get it filled due to pharmacy being closed. Pt has not had any treatment for his blood pressure since Friday and blood pressure per family member was 270/116 this morning at 0715.

## 2014-04-06 ENCOUNTER — Ambulatory Visit (INDEPENDENT_AMBULATORY_CARE_PROVIDER_SITE_OTHER): Payer: Medicaid Other | Admitting: Cardiology

## 2014-04-06 ENCOUNTER — Encounter: Payer: Self-pay | Admitting: Cardiology

## 2014-04-06 VITALS — BP 192/98 | HR 56 | Ht 64.0 in | Wt 191.0 lb

## 2014-04-06 DIAGNOSIS — I10 Essential (primary) hypertension: Secondary | ICD-10-CM

## 2014-04-06 DIAGNOSIS — R9431 Abnormal electrocardiogram [ECG] [EKG]: Secondary | ICD-10-CM

## 2014-04-06 MED ORDER — SPIRONOLACTONE 25 MG PO TABS: 25.0000 mg | ORAL_TABLET | Freq: Every day | ORAL | Status: DC

## 2014-04-06 MED ORDER — CARVEDILOL 6.25 MG PO TABS: 6.2500 mg | ORAL_TABLET | Freq: Two times a day (BID) | ORAL | Status: DC

## 2014-04-06 NOTE — Progress Notes (Signed)
[address] [place], [place]  [address] Phone: [phone] Fax:  [phone]  Date:  04/06/2014   ID:  Travis PlumJames Grandberry, DOB 1960-07-28, MRN [id number]  PCP:  Kristian CoveyPHIFER,NANCY WELCOME, MD   History of Present Illness: Travis Barrera is a 53 y.o. male here for follow up of hypertension. Blood pressure has been as high as 270 systolic recently. [facility] and had severely elevated blood pressure and was sent to the emergency room. Denies any chest pain, visual symptoms, strokelike symptoms.  Mother has CAD stent placed.  Has been on amlodipine and lisinopril. Compliant with medications.    Nuclear stress test on 09/17/12 was low risk, no ischemia, normal EF Echocardiogram on 09/17/12 showed normal ejection fraction, mild left ventricular hypertrophy, diastolic dysfunction, trace valvular lesions.   Wt Readings from Last 3 Encounters:  04/06/14 191 lb (86.637 kg)  04/05/14 196 lb (88.905 kg)  03/24/14 196 lb (88.905 kg)     Past Medical History  Diagnosis Date  . Hypertension     Past Surgical History  Procedure Laterality Date  . Appendectomy      Current Outpatient Prescriptions  Medication Sig Dispense Refill  . amLODipine (NORVASC) 10 MG tablet Take 10 mg by mouth daily.      Marland Kitchen. aspirin 325 MG tablet Take 325 mg by mouth daily. For arthritis pain      . cetirizine (ZYRTEC) 10 MG tablet Take 10 mg by mouth daily.      Marland Kitchen. guaifenesin (ROBITUSSIN) 100 MG/5ML syrup Take 100-200 mg by mouth every 4 (four) hours as needed for cough or congestion.      Marland Kitchen. HYDROcodone-acetaminophen (HYCET) 7.5-325 mg/15 ml solution Take 15 mLs by mouth at bedtime as needed for moderate pain or severe pain.      Marland Kitchen. lisinopril (PRINIVIL,ZESTRIL) 40 MG tablet Take 40 mg by mouth daily.      . meloxicam (MOBIC) 7.5 MG tablet Take 7.5 mg by mouth daily.      . pravastatin (PRAVACHOL) 20 MG tablet Take 20 mg by mouth daily.       No current  facility-administered medications for this visit.    Allergies:    Allergies  Allergen Reactions  . Sulfonamide Derivatives     unknown    Social History:  The patient  reports that he has never smoked. He does not have any smokeless tobacco history on file. He reports that he does not drink alcohol or use illicit drugs.   Family History  Problem Relation Age of Onset  . Hypertension Mother   . Heart disease Maternal Grandmother     ROS:  Please see the history of present illness.   Denies any fevers, chills, orthopnea, PND   All other systems reviewed and negative.   PHYSICAL EXAM: VS:  BP 192/98  Pulse 56  Ht 5\' 4"  (1.626 m)  Wt 191 lb (86.637 kg)  BMI 32.77 kg/m2 Well nourished, well developed, in no acute distress HEENT: normal, [place]/AT, EOMI Neck: no JVD, normal carotid upstroke, no bruit Cardiac:  normal S1, S2; RRR; no murmur Lungs:  clear to auscultation bilaterally, no wheezing, rhonchi or rales Abd: soft, nontender, no hepatomegaly, no bruits Ext: no edema, 2+ distal pulses Skin: warm and dry GU: deferred Neuro: no focal abnormalities noted, AAO x 3  EKG:  Sinus rhythm, T-wave inversion inferior lateral leads. 09/17/12. Heart rate 74 bpm.    Labs: 04/03/14-Creatinine 1.28,  potassium 3.9, hemoglobin 13.1  ASSESSMENT AND PLAN:  1. Difficult to control hypertension/uncontrolled-I will add spironolactone 25 mg once a day as well as carvedilol 6.25 mg twice a day. I will see him back in 1 week. Check basic metabolic profile in one week. Watch for signs of hyperkalemia on spironolactone. Continue with amlodipine and lisinopril. If we are unsuccessful at controlling his blood pressure with multidrug regimen, we will consider obtaining a secondary workup such as renal ultrasound, renin/aldosterone ratio. Currently he is without symptoms, no chest pain, no visual disturbance, no strokelike symptoms, no shortness of breath. Try to limit NSAIDs. 2. Abnormal EKG-chronic changes.  Echocardiogram, stress test reassuring mild LVH. 3. One-week follow-up.  Signed, Donato SchultzMark Skains, MD [facility]FACC  04/06/2014 10:14 AM

## 2014-04-06 NOTE — Patient Instructions (Signed)
Please start Carvedilol 6.25 mg one twice a day. Start Spironolactone 25 mg a day. Continue all other medications as listed  Please have blood work next week when you see us back. (BMP)  Follow up in 1 week with Dr Anne FuSkains.

## 2014-04-08 ENCOUNTER — Telehealth: Payer: Self-pay | Admitting: Cardiology

## 2014-04-08 NOTE — Telephone Encounter (Signed)
Spoke with pt's wife she  Said that pt's BP 20 minutes ago was 272/138 with pt's home BP machine. Family member  check pt's BP while on the phone 241/100. Pt denies any other symptoms. Pt took Carvedilol 6.25 mg and Spironolactone 25 mg this AM. Pt is to continue taking  Lisinopril 40 mg and Amlodipine 10 mg daily, but he has not taken these medications because he/she have not had money for refills. Wife is aware to get these medications  today if possible. Pt's wife states will to the  pharmacy now to get the medication and give the medication to pt ASAP. Pt/wife will call the office back  if needed. Pt has a return visit with Dr. Anne FuSkains on 04/14/14.

## 2014-04-08 NOTE — Telephone Encounter (Signed)
New message     mother calling    Patient C/O blood pressure  272/138  Home machine. About  10 min ago.

## 2014-04-08 NOTE — Telephone Encounter (Signed)
Agree with restarting lisinopril and amlodipine. Please call with blood pressure tomorrow.

## 2014-04-08 NOTE — Telephone Encounter (Signed)
Pt and wife aware to call the office tomorrow with BP reading. Pt's wife verbalized understanding.

## 2014-04-14 ENCOUNTER — Ambulatory Visit (INDEPENDENT_AMBULATORY_CARE_PROVIDER_SITE_OTHER): Payer: Medicaid Other | Admitting: Cardiology

## 2014-04-14 ENCOUNTER — Encounter: Payer: Self-pay | Admitting: Cardiology

## 2014-04-14 VITALS — BP 150/94 | HR 52 | Ht 64.0 in | Wt 190.8 lb

## 2014-04-14 DIAGNOSIS — Z79899 Other long term (current) drug therapy: Secondary | ICD-10-CM

## 2014-04-14 DIAGNOSIS — I10 Essential (primary) hypertension: Secondary | ICD-10-CM

## 2014-04-14 DIAGNOSIS — R9431 Abnormal electrocardiogram [ECG] [EKG]: Secondary | ICD-10-CM

## 2014-04-14 LAB — BASIC METABOLIC PANEL
BUN: 15 mg/dL (ref 6–23)
CALCIUM: 9.5 mg/dL (ref 8.4–10.5)
CO2: 23 meq/L (ref 19–32)
Chloride: 105 mEq/L (ref 96–112)
Creatinine, Ser: 1.3 mg/dL (ref 0.4–1.5)
GFR: 71.27 mL/min (ref >60.00)
Glucose, Bld: 88 mg/dL (ref 70–99)
Potassium: 4 mEq/L (ref 3.5–5.1)
SODIUM: 139 meq/L (ref 135–145)

## 2014-04-14 MED ORDER — CLONIDINE HCL 0.1 MG PO TABS: 0.1000 mg | ORAL_TABLET | Freq: Once | ORAL | Status: DC

## 2014-04-14 NOTE — Progress Notes (Signed)
[address] [place], [place]  [address] Phone: [phone] Fax:  [phone]  Date:  04/14/2014   ID:  Travis Barrera Mule, DOB Aug 08, 1960, MRN [id number]  PCP:  Kristian CoveyPHIFER,NANCY WELCOME, MD   History of Present Illness: Travis Barrera Dias is a 53 y.o. male here for follow up of hypertension. Blood pressure has been as high as 270 systolic recently. He was seen at [facility] and had severely elevated blood pressure and was sent to the emergency room. Denies any chest pain, visual symptoms, strokelike symptoms.  Mother has CAD stent placed.  Has been on amlodipine and lisinopril. Compliant with medications.  Spironolactone and carvedilol were added. Clonidine also. Tolerating well.    Nuclear stress test on 09/17/12 was low risk, no ischemia, normal EF Echocardiogram on 09/17/12 showed normal ejection fraction, mild left ventricular hypertrophy, diastolic dysfunction, trace valvular lesions.   Wt Readings from Last 3 Encounters:  04/14/14 190 lb 12.8 oz (86.546 kg)  04/06/14 191 lb (86.637 kg)  04/05/14 196 lb (88.905 kg)     Past Medical History  Diagnosis Date  . Hypertension     Past Surgical History  Procedure Laterality Date  . Appendectomy      Current Outpatient Prescriptions  Medication Sig Dispense Refill  . amLODipine (NORVASC) 10 MG tablet Take 10 mg by mouth daily.    Marland Kitchen. aspirin 325 MG tablet Take 325 mg by mouth daily. For arthritis pain    . benzonatate (TESSALON) 100 MG capsule   0  . carvedilol (COREG) 6.25 MG tablet Take 1 tablet (6.25 mg total) by mouth 2 (two) times daily. 60 tablet 3  . cetirizine (ZYRTEC) 10 MG tablet Take 10 mg by mouth daily.    Marland Kitchen. guaifenesin (ROBITUSSIN) 100 MG/5ML syrup Take 100-200 mg by mouth every 4 (four) hours as needed for cough or congestion.    Marland Kitchen. HYDROcodone-acetaminophen (HYCET) 7.5-325 mg/15 ml solution Take 15 mLs by mouth at bedtime as needed for moderate pain or severe pain.    Marland Kitchen. lisinopril  (PRINIVIL,ZESTRIL) 40 MG tablet Take 40 mg by mouth daily.    . meloxicam (MOBIC) 7.5 MG tablet Take 7.5 mg by mouth daily.    . pravastatin (PRAVACHOL) 20 MG tablet Take 20 mg by mouth daily.    Marland Kitchen. spironolactone (ALDACTONE) 25 MG tablet Take 1 tablet (25 mg total) by mouth daily. 30 tablet 3   No current facility-administered medications for this visit.    Allergies:    Allergies  Allergen Reactions  . Sulfonamide Derivatives     unknown    Social History:  The patient  reports that he has never smoked. He does not have any smokeless tobacco history on file. He reports that he does not drink alcohol or use illicit drugs.   Family History  Problem Relation Age of Onset  . Hypertension Mother   . Heart disease Maternal Grandmother     ROS:  Please see the history of present illness.   Denies any fevers, chills, orthopnea, PND   All other systems reviewed and negative.   PHYSICAL EXAM: VS:  BP 150/94 mmHg  Pulse 52  Ht 5\' 4"  (1.626 m)  Wt 190 lb 12.8 oz (86.546 kg)  BMI 32.73 kg/m2 Well nourished, well developed, in no acute distress HEENT: normal, [place]/AT, EOMI Neck: no JVD, normal carotid upstroke, no bruit Cardiac:  normal S1, S2; RRR; no murmur Lungs:  clear to auscultation bilaterally, no wheezing, rhonchi  or rales Abd: soft, nontender, no hepatomegaly, no bruits Ext: no edema, 2+ distal pulses Skin: warm and dry GU: deferred Neuro: no focal abnormalities noted, AAO x 3  EKG:  Sinus rhythm, T-wave inversion inferior lateral leads. 09/17/12. Heart rate 74 bpm.    Labs: 04/03/14-Creatinine 1.28, potassium 3.9, hemoglobin 13.1  ASSESSMENT AND PLAN:  1. Difficult to control hypertension/uncontrolled-improved.Added spironolactone 25 mg once a day as well as carvedilol 6.25 mg twice a day. Cannot increase carvedilol because of bradycardia. I will add clonidine 0.1 mg twice a day. Continue with weight loss. Checking basic metabolic profile. I will see him back in 2 weeks. Watch  for signs of hyperkalemia on spironolactone. Continue with amlodipine and lisinopril. If we are unsuccessful at controlling his blood pressure with multidrug regimen, we will consider obtaining a secondary workup such as renal ultrasound, renin/aldosterone ratio. Currently he is without symptoms, no chest pain, no visual disturbance, no strokelike symptoms, no shortness of breath. Try to limit NSAIDs. 2. Abnormal EKG-chronic changes. Echocardiogram, stress test reassuring mild LVH. 3. Two-week follow-up.  Signed, Donato SchultzMark Chianti Goh, MD [facility]  04/14/2014 11:11 AM

## 2014-04-14 NOTE — Patient Instructions (Signed)
Please start Clonidine 0.1 mg twice a day. Continue all other medications as listed.  Please have blood work today (BMP)  Follow up in 2 weeks with Dr Anne FuSkains.

## 2014-04-29 ENCOUNTER — Ambulatory Visit: Payer: Medicaid Other | Admitting: Cardiology

## 2014-05-27 ENCOUNTER — Encounter: Payer: Self-pay | Admitting: *Deleted

## 2014-05-28 ENCOUNTER — Encounter: Payer: Self-pay | Admitting: Cardiology

## 2014-05-28 ENCOUNTER — Ambulatory Visit (INDEPENDENT_AMBULATORY_CARE_PROVIDER_SITE_OTHER): Payer: Medicaid Other | Admitting: Cardiology

## 2014-05-28 VITALS — BP 128/82 | HR 63 | Ht 64.0 in | Wt 195.0 lb

## 2014-05-28 DIAGNOSIS — Z79899 Other long term (current) drug therapy: Secondary | ICD-10-CM

## 2014-05-28 DIAGNOSIS — I10 Essential (primary) hypertension: Secondary | ICD-10-CM

## 2014-05-28 LAB — BASIC METABOLIC PANEL
BUN: 16 mg/dL (ref 6–23)
CHLORIDE: 105 meq/L (ref 96–112)
CO2: 23 mEq/L (ref 19–32)
Calcium: 9.5 mg/dL (ref 8.4–10.5)
Creatinine, Ser: 1.3 mg/dL (ref 0.4–1.5)
GFR: 75.11 mL/min (ref >60.00)
Glucose, Bld: 110 mg/dL — ABNORMAL HIGH (ref 70–99)
Potassium: 3.8 mEq/L (ref 3.5–5.1)
SODIUM: 138 meq/L (ref 135–145)

## 2014-05-28 NOTE — Progress Notes (Signed)
[address] [place], [place]  [address] Phone: [phone](336) [phone] Fax:  [phone](336) [phone]  Date:  05/28/2014   ID:  Travis PlumJames Folse, DOB [birth year], MRN [id number]  PCP:  Alvester MorinPHIFER, NANCY W, MD   History of Present Illness: Travis Barrera is a 53 y.o. male here for follow up of hypertension. Blood pressure has been as high as 270 systolic recently. He was seen at [facility] and had severely elevated blood pressure and was sent to the emergency room. Denies any chest pain, visual symptoms, strokelike symptoms.  Mother has CAD stent placed.  Has been on amlodipine and lisinopril. Compliant with medications.  Spironolactone and carvedilol were added. Clonidine also. Tolerating well.  He feels well, no symptoms. No chest pain, no shortness of breath. Wife is present, wearing Santa hat.  Nuclear stress test on 09/17/12 was low risk, no ischemia, normal EF Echocardiogram on 09/17/12 showed normal ejection fraction, mild left ventricular hypertrophy, diastolic dysfunction, trace valvular lesions.   Wt Readings from Last 3 Encounters:  05/28/14 195 lb (88.451 kg)  04/14/14 190 lb 12.8 oz (86.546 kg)  04/06/14 191 lb (86.637 kg)     Past Medical History  Diagnosis Date  . Hypertension     Past Surgical History  Procedure Laterality Date  . Appendectomy      Current Outpatient Prescriptions  Medication Sig Dispense Refill  . amLODipine (NORVASC) 10 MG tablet Take 10 mg by mouth daily.    Marland Kitchen. aspirin 325 MG tablet Take 325 mg by mouth daily. For arthritis pain    . benzonatate (TESSALON) 100 MG capsule   0  . carvedilol (COREG) 6.25 MG tablet Take 1 tablet (6.25 mg total) by mouth 2 (two) times daily. 60 tablet 3  . cetirizine (ZYRTEC) 10 MG tablet Take 10 mg by mouth daily.    . cloNIDine (CATAPRES) 0.1 MG tablet Take 1 tablet (0.1 mg total) by mouth once. 60 tablet 11  . guaifenesin (ROBITUSSIN) 100 MG/5ML syrup Take 100-200 mg by mouth every 4 (four) hours as  needed for cough or congestion.    . hydrochlorothiazide (HYDRODIURIL) 12.5 MG tablet   3  . HYDROcodone-acetaminophen (HYCET) 7.5-325 mg/15 ml solution Take 15 mLs by mouth at bedtime as needed for moderate pain or severe pain.    Marland Kitchen. lisinopril (PRINIVIL,ZESTRIL) 40 MG tablet Take 40 mg by mouth daily.    . meloxicam (MOBIC) 7.5 MG tablet Take 7.5 mg by mouth daily.    . pravastatin (PRAVACHOL) 20 MG tablet Take 20 mg by mouth daily.    Marland Kitchen. spironolactone (ALDACTONE) 25 MG tablet Take 1 tablet (25 mg total) by mouth daily. 30 tablet 3   No current facility-administered medications for this visit.    Allergies:    Allergies  Allergen Reactions  . Sulfonamide Derivatives     unknown    Social History:  The patient  reports that he has never smoked. He does not have any smokeless tobacco history on file. He reports that he does not drink alcohol or use illicit drugs.   Family History  Problem Relation Age of Onset  . Hypertension Mother   . Heart disease Maternal Grandmother     ROS:  Please see the history of present illness.   Denies any fevers, chills, orthopnea, PND   All other systems reviewed and negative.   PHYSICAL EXAM: VS:  BP 128/82 mmHg  Pulse 63  Ht 5\' 4"  (1.626 m)  Wt  195 lb (88.451 kg)  BMI 33.46 kg/m2 Well nourished, well developed, in no acute distress HEENT: normal, [place]/AT, EOMI Neck: no JVD, normal carotid upstroke, no bruit Cardiac:  normal S1, S2; RRR; no murmur Lungs:  clear to auscultation bilaterally, no wheezing, rhonchi or rales Abd: soft, nontender, no hepatomegaly, no bruits Ext: no edema, 2+ distal pulses Skin: warm and dry GU: deferred Neuro: no focal abnormalities noted, AAO x 3  EKG:  Sinus rhythm, T-wave inversion inferior lateral leads. 09/17/12. Heart rate 74 bpm.    Labs: 04/03/14-Creatinine 1.28, potassium 3.9, hemoglobin 13.1  ASSESSMENT AND PLAN:  1. Difficult to control hypertension/uncontrolled-improved currently. Previously Added  spironolactone 25 mg once a day as well as carvedilol 6.25 mg twice a day. Cannot increase carvedilol because of bradycardia, clonidine 0.1 mg twice a day. Continue with weight loss. Checking basic metabolic profile again today to ensure no evidence of hyperkalemia. Currently he is without symptoms, no chest pain, no visual disturbance, no strokelike symptoms, no shortness of breath. Try to limit NSAIDs. 2. Abnormal EKG-chronic changes. Echocardiogram, stress test reassuring mild LVH. 3. 6 month follow-up.  Signed, Donato SchultzMark Skains, MD [facility]  05/28/2014 9:45 AM

## 2014-05-28 NOTE — Patient Instructions (Signed)
The current medical regimen is effective;  continue present plan and medications.  Please have blood work today (BMP)  Follow up in 6 months with Dr. Anne FuSkains.  You will receive a letter in the mail 2 months before you are due.  Please call us when you receive this letter to schedule your follow up appointment.

## 2014-10-16 ENCOUNTER — Other Ambulatory Visit: Payer: Self-pay

## 2014-10-16 MED ORDER — SPIRONOLACTONE 25 MG PO TABS: 25.0000 mg | ORAL_TABLET | Freq: Every day | ORAL | Status: DC

## 2014-10-16 MED ORDER — CARVEDILOL 6.25 MG PO TABS: 6.2500 mg | ORAL_TABLET | Freq: Two times a day (BID) | ORAL | Status: DC

## 2014-11-20 ENCOUNTER — Telehealth: Payer: Self-pay | Admitting: *Deleted

## 2014-11-20 NOTE — Telephone Encounter (Signed)
I CALLED THE PT TO GET FAMILY HX INFORMATION. I LEFT A VM FOR THE PT TO CALL BACK WITH FAMILY HX.

## 2014-11-25 ENCOUNTER — Encounter: Payer: Self-pay | Admitting: Cardiology

## 2014-11-25 ENCOUNTER — Ambulatory Visit (INDEPENDENT_AMBULATORY_CARE_PROVIDER_SITE_OTHER): Payer: Medicaid Other | Admitting: Cardiology

## 2014-11-25 VITALS — BP 152/90 | HR 53 | Ht 64.0 in | Wt 189.8 lb

## 2014-11-25 DIAGNOSIS — I10 Essential (primary) hypertension: Secondary | ICD-10-CM | POA: Diagnosis not present

## 2014-11-25 DIAGNOSIS — Z91199 Patient's noncompliance with other medical treatment and regimen due to unspecified reason: Secondary | ICD-10-CM

## 2014-11-25 DIAGNOSIS — Z9119 Patient's noncompliance with other medical treatment and regimen: Secondary | ICD-10-CM

## 2014-11-25 DIAGNOSIS — R9431 Abnormal electrocardiogram [ECG] [EKG]: Secondary | ICD-10-CM

## 2014-11-25 MED ORDER — HYDROCHLOROTHIAZIDE 12.5 MG PO TABS: 12.5000 mg | ORAL_TABLET | Freq: Every day | ORAL | Status: DC

## 2014-11-25 MED ORDER — AMLODIPINE BESYLATE 10 MG PO TABS: 10.0000 mg | ORAL_TABLET | Freq: Every day | ORAL | Status: DC

## 2014-11-25 MED ORDER — PRAVASTATIN SODIUM 20 MG PO TABS: 20.0000 mg | ORAL_TABLET | Freq: Every day | ORAL | Status: DC

## 2014-11-25 MED ORDER — CARVEDILOL 6.25 MG PO TABS: 6.2500 mg | ORAL_TABLET | Freq: Two times a day (BID) | ORAL | Status: DC

## 2014-11-25 MED ORDER — SPIRONOLACTONE 25 MG PO TABS: 25.0000 mg | ORAL_TABLET | Freq: Every day | ORAL | Status: DC

## 2014-11-25 MED ORDER — LISINOPRIL 40 MG PO TABS: 40.0000 mg | ORAL_TABLET | Freq: Every day | ORAL | Status: DC

## 2014-11-25 NOTE — Progress Notes (Signed)
[address] [place], [place]  [address] Phone: [phone] Fax:  [phone]  Date:  11/25/2014   ID:  Travis Barrera, DOB 08-30-1960, MRN [id number]  PCP:  Steele Berg Phifer, MD   History of Present Illness: Travis Barrera is a 54 y.o. male here for follow up of hypertension. Blood pressure has been as high as 270 systolic recently. He was seen at [facility] and had severely elevated blood pressure and was sent to the emergency room. Denies any chest pain, visual symptoms, strokelike symptoms.  Mother has CAD stent placed.  Has been on amlodipine and lisinopril. Compliant with medications.  Spironolactone and carvedilol were added. Clonidine also. Tolerating well.  He feels well, no symptoms. No chest pain, no shortness of breath. Wife is present, wearing Santa hat.  Nuclear stress test on 09/17/12 was low risk, no ischemia, normal EF Echocardiogram on 09/17/12 showed normal ejection fraction, mild left ventricular hypertrophy, diastolic dysfunction, trace valvular lesions.   Wt Readings from Last 3 Encounters:  11/25/14 189 lb 12.8 oz (86.093 kg)  05/28/14 195 lb (88.451 kg)  04/14/14 190 lb 12.8 oz (86.546 kg)     Past Medical History  Diagnosis Date  . Hypertension     Past Surgical History  Procedure Laterality Date  . Appendectomy      Current Outpatient Prescriptions  Medication Sig Dispense Refill  . lisinopril (PRINIVIL,ZESTRIL) 40 MG tablet Take 40 mg by mouth daily.    Marland Kitchen amLODipine (NORVASC) 10 MG tablet Take 10 mg by mouth daily.    Marland Kitchen aspirin 325 MG tablet Take 325 mg by mouth daily. For arthritis pain    . benzonatate (TESSALON) 100 MG capsule   0  . carvedilol (COREG) 6.25 MG tablet Take 1 tablet (6.25 mg total) by mouth 2 (two) times daily. (Patient not taking: Reported on 11/25/2014) 60 tablet 3  . cetirizine (ZYRTEC) 10 MG tablet Take 10 mg by mouth daily.    . cloNIDine (CATAPRES) 0.1 MG tablet Take 1 tablet (0.1 mg total)  by mouth once. (Patient not taking: Reported on 11/25/2014) 60 tablet 11  . guaifenesin (ROBITUSSIN) 100 MG/5ML syrup Take 100-200 mg by mouth every 4 (four) hours as needed for cough or congestion.    . hydrochlorothiazide (HYDRODIURIL) 12.5 MG tablet   3  . HYDROcodone-acetaminophen (HYCET) 7.5-325 mg/15 ml solution Take 15 mLs by mouth at bedtime as needed for moderate pain or severe pain.    . meloxicam (MOBIC) 7.5 MG tablet Take 7.5 mg by mouth daily.    . pravastatin (PRAVACHOL) 20 MG tablet Take 20 mg by mouth daily.    Marland Kitchen spironolactone (ALDACTONE) 25 MG tablet Take 1 tablet (25 mg total) by mouth daily. (Patient not taking: Reported on 11/25/2014) 30 tablet 3   No current facility-administered medications for this visit.    Allergies:    Allergies  Allergen Reactions  . Sulfonamide Derivatives     unknown    Social History:  The patient  reports that he has never smoked. He does not have any smokeless tobacco history on file. He reports that he does not drink alcohol or use illicit drugs.   Family History  Problem Relation Age of Onset  . Hypertension Mother   . Heart disease Maternal Grandmother     ROS:  Please see the history of present illness.   Denies any fevers, chills, orthopnea, PND   All other systems reviewed and negative.  PHYSICAL EXAM: VS:  BP 152/90 mmHg  Pulse 53  Ht  (1.626 m)  Wt 189 lb 12.8 oz (86.093 kg)  BMI 32.56 kg/m2 Well nourished, well developed, in no acute distress HEENT: normal, [place]/AT, EOMI Neck: no JVD, normal carotid upstroke, no bruit Cardiac:  normal S1, S2; RRR; no murmur Lungs:  clear to auscultation bilaterally, no wheezing, rhonchi or rales Abd: soft, nontender, no hepatomegaly, no bruits Ext: no edema, 2+ distal pulses Skin: warm and dry GU: deferred Neuro: no focal abnormalities noted, AAO x 3  EKG:  Sinus rhythm, T-wave inversion inferior lateral leads. 09/17/12. Heart rate 74 bpm.    Labs: 04/03/14-Creatinine 1.28,  potassium 3.9, hemoglobin 13.1  ASSESSMENT AND PLAN:  1. Difficult to control hypertension/uncontrolled-improved currently. Previously Added spironolactone 25 mg once a day as well as carvedilol 6.25 mg twice a day. Cannot increase carvedilol because of bradycardia, previously on clonidine 0.1 mg twice a day. He has been out of all his medications except for the lisinopril. I will go ahead and refill all of his blood pressure medications except for the clonidine. He may be able to maintain adequate blood pressure control without this medication. He will be seeing his primary physician/provider in 2 weeks. Continue with weight loss. Prior basic metabolic profile after using Aldactone showed no evidence of hyperkalemia. Currently he is without symptoms, no chest pain, no visual disturbance, no strokelike symptoms, no shortness of breath. Try to limit NSAIDs. 2. Medical noncompliance. He has been out of several of his medications. Only taking lisinopril 3. Abnormal EKG-chronic changes. Echocardiogram, stress test reassuring mild LVH. 4. 6 month follow-up.  Signed, Donato Schultz, MD [facility]  11/25/2014 9:57 AM

## 2014-11-25 NOTE — Patient Instructions (Signed)
Medication Instructions:  Your physician recommends that you continue on your current medications as directed. Please refer to the Current Medication list given to you today.  Follow-Up: Follow up in 6 months with Scott Weaver, PA.  You will receive a letter in the mail 2 months before you are due.  Please call us when you receive this letter to schedule your follow up appointment.  Thank you for choosing [facility]!!       

## 2015-05-24 ENCOUNTER — Ambulatory Visit: Payer: Medicaid Other | Admitting: Physician Assistant

## 2015-05-27 ENCOUNTER — Encounter: Payer: Self-pay | Admitting: Cardiology

## 2015-05-27 ENCOUNTER — Ambulatory Visit (INDEPENDENT_AMBULATORY_CARE_PROVIDER_SITE_OTHER): Payer: Medicaid Other | Admitting: Cardiology

## 2015-05-27 VITALS — BP 176/90 | HR 55 | Ht 64.0 in | Wt 199.0 lb

## 2015-05-27 DIAGNOSIS — Z23 Encounter for immunization: Secondary | ICD-10-CM | POA: Diagnosis not present

## 2015-05-27 DIAGNOSIS — I10 Essential (primary) hypertension: Secondary | ICD-10-CM

## 2015-05-27 DIAGNOSIS — E785 Hyperlipidemia, unspecified: Secondary | ICD-10-CM | POA: Diagnosis not present

## 2015-05-27 DIAGNOSIS — M791 Myalgia, unspecified site: Secondary | ICD-10-CM

## 2015-05-27 MED ORDER — CLONIDINE HCL 0.1 MG PO TABS: 0.1000 mg | ORAL_TABLET | Freq: Two times a day (BID) | ORAL | Status: DC

## 2015-05-27 NOTE — Progress Notes (Signed)
Cardiology Office Note   Date:  05/27/2015   ID:  Travis PlumJames Bailon, DOB 1961/03/08, MRN [id number]  PCP:  Steele BergNancy W Phifer, MD  Cardiologist:  Dr. Anne FuSkains    Chief Complaint  Patient presents with  . Hypertension    out of meds      History of Present Illness: Travis Barrera is a 54 y.o. male who presents for 6 month follow up due to HTN.  Pt with a hx of difficult to control HTN.  Hx of bradycardia with increased BB dose. Nuclear stress test on 09/17/12 was low risk, no ischemia, normal EF.  Echocardiogram on 09/17/12 showed normal ejection fraction, mild left ventricular hypertrophy, diastolic dysfunction, trace valvular disease.  Today his mom made the appt. Due to being out of his meds for 2 weeks.  He has taken them for the last several days though.  He complained of Lt side pain from ribs down to waist. Mild pain and present with palpation and bending to the Lt.  No chest pain or SOB.  He does exercise by walking. He does not have a PCP or has not seen.  He has not had a flu shot.      Past Medical History  Diagnosis Date  . Hypertension     Past Surgical History  Procedure Laterality Date  . Appendectomy       Current Outpatient Prescriptions  Medication Sig Dispense Refill  . amLODipine (NORVASC) 10 MG tablet Take 1 tablet (10 mg total) by mouth daily. 30 tablet 11  . aspirin 325 MG tablet Take 325 mg by mouth daily. For arthritis pain    . benzonatate (TESSALON) 100 MG capsule Take 100 mg by mouth 2 (two) times daily.   0  . carvedilol (COREG) 6.25 MG tablet Take 1 tablet (6.25 mg total) by mouth 2 (two) times daily. 60 tablet 11  . cetirizine (ZYRTEC) 10 MG tablet Take 10 mg by mouth daily.    Marland Kitchen. guaifenesin (ROBITUSSIN) 100 MG/5ML syrup Take 100-200 mg by mouth every 4 (four) hours as needed for cough or congestion.    . hydrochlorothiazide (HYDRODIURIL) 12.5 MG tablet Take 1 tablet (12.5 mg total) by mouth daily. 30 tablet 11  . HYDROcodone-acetaminophen (HYCET) 7.5-325  mg/15 ml solution Take 15 mLs by mouth at bedtime as needed for moderate pain or severe pain.    Marland Kitchen. lisinopril (PRINIVIL,ZESTRIL) 40 MG tablet Take 1 tablet (40 mg total) by mouth daily. 30 tablet 11  . meloxicam (MOBIC) 7.5 MG tablet Take 7.5 mg by mouth daily.    . pravastatin (PRAVACHOL) 20 MG tablet Take 1 tablet (20 mg total) by mouth daily. 30 tablet 11  . spironolactone (ALDACTONE) 25 MG tablet Take 1 tablet (25 mg total) by mouth daily. 30 tablet 11   No current facility-administered medications for this visit.    Allergies:   Sulfonamide derivatives    Social History:  The patient  reports that he has never smoked. He does not have any smokeless tobacco history on file. He reports that he does not drink alcohol or use illicit drugs.   Family History:  The patient's family history includes Heart disease in his maternal grandmother; Hypertension in his mother.    ROS:  General:no colds or fevers,  weight increase Skin:no rashes or ulcers HEENT:no blurred vision, no congestion CV:see HPI PUL:see HPI GI:no diarrhea constipation or melena, no indigestion GU:no hematuria, no dysuria MS:no joint pain, no claudication Neuro:no syncope, no lightheadedness Endo:no diabetes, no  thyroid disease  Wt Readings from Last 3 Encounters:  05/27/15 199 lb (90.266 kg)  11/25/14 189 lb 12.8 oz (86.093 kg)  05/28/14 195 lb (88.451 kg)     PHYSICAL EXAM: VS:  BP 176/90 mmHg  Pulse 55  Ht  (1.626 m)  Wt 199 lb (90.266 kg)  BMI 34.14 kg/m2 , BMI Body mass index is 34.14 kg/(m^2). General:Pleasant affect, NAD Skin:Warm and dry, brisk capillary refill HEENT:normocephalic, sclera clear, mucus membranes moist Neck:supple, no JVD, no bruits  Heart:S1S2 RRR with 2/3 systolic murmur, no gallup, rub or click Lungs:clear without rales, rhonchi, or wheezes LKG:MWNU, non tender, + BS, do not palpate liver spleen or masses Ext:no lower ext edema, 2+ pedal pulses, 2+ radial pulses Neuro:alert  and oriented X 3, MAE, follows commands, + facial symmetry       EKG:  EKG is ordered today. The ekg ordered today demonstrates SBrady at 55 LVH no acute changes from previous EKGs.    Recent Labs: 05/28/2014: BUN 16; Creatinine, Ser 1.3; Potassium 3.8; Sodium 138    Lipid Panel    Component Value Date/Time   CHOL 231* 05/09/2010 2124   TRIG 92 05/09/2010 2124   HDL 43 05/09/2010 2124   CHOLHDL 5.4 Ratio 05/09/2010 2124   VLDL 18 05/09/2010 2124   LDLCALC 170* 05/09/2010 2124       Other studies Reviewed: Additional studies/ records that were reviewed today include: echo, nuc, labs.   ASSESSMENT AND PLAN:  1.  HTN continues to be elevated though he was on his meds. for last several days will add back his clonidine 0.1 mg BID.  Will recheck BP in 2 weeks  2.  Hyperlipidemia on pravastatin, will check fasting lipids and CMP when pt returns for BP recheck.  3. Flu vaccine - health maintenance   4. muscular skeletal pain of lateral diaphramatic wall mild he will call if increases.    Current medicines are reviewed with the patient today.  The patient Has no concerns regarding medicines.  The following changes have been made:  See above Labs/ tests ordered today include:see above  Disposition:   FU:  see above  Nyoka Lint, NP  05/27/2015 2:47 PM    [facility] [address] [place], [place], [place]  [address]/ [address] [place], [place] Phone: [phone]; Fax: [phone]  [phone]

## 2015-05-27 NOTE — Patient Instructions (Signed)
Medication Instructions:  RESUME Clonidine 0.1mg  Twice Daily. An Rx has been sent to your pharmacy  Labwork: Your physician recommends that you return for a FASTING lipid profile, cmet, tsh on 06/09/15   Testing/Procedures: None ordered  Follow-Up: You have a follow up appointment scheduled on 06/09/15 @ 8:30am with Lady SaucierLaura Ingold,NP  Any Other Special Instructions Will Be Listed Below (If Applicable).     If you need a refill on your cardiac medications before your next appointment, please call your pharmacy.

## 2015-06-09 ENCOUNTER — Other Ambulatory Visit (INDEPENDENT_AMBULATORY_CARE_PROVIDER_SITE_OTHER): Payer: Medicaid Other | Admitting: *Deleted

## 2015-06-09 ENCOUNTER — Ambulatory Visit (INDEPENDENT_AMBULATORY_CARE_PROVIDER_SITE_OTHER): Payer: Medicaid Other | Admitting: Cardiology

## 2015-06-09 ENCOUNTER — Encounter: Payer: Self-pay | Admitting: Cardiology

## 2015-06-09 VITALS — BP 150/91 | HR 56 | Ht 64.0 in | Wt 191.6 lb

## 2015-06-09 DIAGNOSIS — Z91199 Patient's noncompliance with other medical treatment and regimen due to unspecified reason: Secondary | ICD-10-CM

## 2015-06-09 DIAGNOSIS — E785 Hyperlipidemia, unspecified: Secondary | ICD-10-CM | POA: Diagnosis not present

## 2015-06-09 DIAGNOSIS — Z9119 Patient's noncompliance with other medical treatment and regimen: Secondary | ICD-10-CM | POA: Diagnosis not present

## 2015-06-09 DIAGNOSIS — M791 Myalgia, unspecified site: Secondary | ICD-10-CM

## 2015-06-09 DIAGNOSIS — I10 Essential (primary) hypertension: Secondary | ICD-10-CM

## 2015-06-09 LAB — LIPID PANEL
CHOL/HDL RATIO: 6.9 ratio — AB (ref <=5.0)
Cholesterol: 236 mg/dL — ABNORMAL HIGH (ref 125–200)
HDL: 34 mg/dL — ABNORMAL LOW (ref >=40)
LDL CALC: 177 mg/dL — AB (ref <130)
Triglycerides: 123 mg/dL (ref <150)
VLDL: 25 mg/dL (ref <30)

## 2015-06-09 LAB — TSH: TSH: 1.001 u[IU]/mL (ref 0.350–4.500)

## 2015-06-09 LAB — COMPREHENSIVE METABOLIC PANEL
ALT: 29 U/L (ref 9–46)
AST: 24 U/L (ref 10–35)
Albumin: 4.6 g/dL (ref 3.6–5.1)
Alkaline Phosphatase: 62 U/L (ref 40–115)
BUN: 10 mg/dL (ref 7–25)
CO2: 25 mmol/L (ref 20–31)
Calcium: 9.8 mg/dL (ref 8.6–10.3)
Chloride: 100 mmol/L (ref 98–110)
Creat: 1.44 mg/dL — ABNORMAL HIGH (ref 0.70–1.33)
GLUCOSE: 124 mg/dL — AB (ref 65–99)
Potassium: 4.1 mmol/L (ref 3.5–5.3)
SODIUM: 140 mmol/L (ref 135–146)
Total Bilirubin: 0.9 mg/dL (ref 0.2–1.2)
Total Protein: 7.5 g/dL (ref 6.1–8.1)

## 2015-06-09 MED ORDER — AMLODIPINE BESYLATE 10 MG PO TABS: 10.0000 mg | ORAL_TABLET | Freq: Every day | ORAL | Status: DC

## 2015-06-09 MED ORDER — SPIRONOLACTONE 25 MG PO TABS: 25.0000 mg | ORAL_TABLET | Freq: Every day | ORAL | Status: DC

## 2015-06-09 MED ORDER — PRAVASTATIN SODIUM 20 MG PO TABS: 20.0000 mg | ORAL_TABLET | Freq: Every day | ORAL | Status: DC

## 2015-06-09 MED ORDER — CARVEDILOL 6.25 MG PO TABS: 6.2500 mg | ORAL_TABLET | Freq: Two times a day (BID) | ORAL | Status: DC

## 2015-06-09 MED ORDER — HYDROCHLOROTHIAZIDE 25 MG PO TABS: 25.0000 mg | ORAL_TABLET | Freq: Every day | ORAL | Status: DC

## 2015-06-09 MED ORDER — LISINOPRIL 40 MG PO TABS: 40.0000 mg | ORAL_TABLET | Freq: Every day | ORAL | Status: DC

## 2015-06-09 NOTE — Patient Instructions (Signed)
Medication Instructions:  Your physician has recommended you make the following change in your medication:  1. Increase HCTZ ( 25 mg ) daily   Labwork: Your physician recommends that you have lab work today/bmet/lft/lipid   Testing/Procedures:  -None  Follow-Up: Your physician recommends that you keep your scheduled  follow-up appointment with Dr. Anne FuSkains   Any Other Special Instructions Will Be Listed Below (If Applicable).     If you need a refill on your cardiac medications before your next appointment, please call your pharmacy.

## 2015-06-09 NOTE — Addendum Note (Signed)
Addended by: Arantxa Piercey K on: 06/09/2015 07:39 AM   Modules accepted: Orders  

## 2015-06-09 NOTE — Progress Notes (Signed)
Cardiology Office Note   Date:  06/09/2015   ID:  Mercy Malena, DOB December 31, 1960, MRN [id number]  PCP:  Steele Berg Phifer, MD  Cardiologist:  Dr. Anne Fu    Chief Complaint  Patient presents with  . Hypertension    no complaints      History of Present Illness: Travis Barrera is a 54 y.o. male who presents for BP follow up  He has hx of HTN. Last seen on the 15th of Dec and BP was 170 systolic.  He had not been on the clonidine   Nuclear stress test on 09/17/12 was low risk, no ischemia, normal EF Echocardiogram on 09/17/12 showed normal ejection fraction, mild left ventricular hypertrophy, diastolic dysfunction, trace valvular lesions  Last visit no chest pain.  He has bradycardia with increased BB.    Today no complaints his chest wall pain has resolved.  He takes his BP meds regularly though at times he cannot afford some of the meds.  Needs refills today.   Past Medical History  Diagnosis Date  . Hypertension     Past Surgical History  Procedure Laterality Date  . Appendectomy       Current Outpatient Prescriptions  Medication Sig Dispense Refill  . amLODipine (NORVASC) 10 MG tablet Take 1 tablet (10 mg total) by mouth daily. 30 tablet 11  . aspirin 325 MG tablet Take 325 mg by mouth daily. For arthritis pain    . benzonatate (TESSALON) 100 MG capsule Take 100 mg by mouth 2 (two) times daily.   0  . carvedilol (COREG) 6.25 MG tablet Take 1 tablet (6.25 mg total) by mouth 2 (two) times daily. 60 tablet 11  . cetirizine (ZYRTEC) 10 MG tablet Take 10 mg by mouth daily.    . cloNIDine (CATAPRES) 0.1 MG tablet Take 1 tablet (0.1 mg total) by mouth 2 (two) times daily. 60 tablet 11  . guaifenesin (ROBITUSSIN) 100 MG/5ML syrup Take 100-200 mg by mouth every 4 (four) hours as needed for cough or congestion.    . hydrochlorothiazide (HYDRODIURIL) 12.5 MG tablet Take 1 tablet (12.5 mg total) by mouth daily. 30 tablet 11  . HYDROcodone-acetaminophen (HYCET) 7.5-325 mg/15 ml solution  Take 15 mLs by mouth at bedtime as needed for moderate pain or severe pain.    Marland Kitchen lisinopril (PRINIVIL,ZESTRIL) 40 MG tablet Take 1 tablet (40 mg total) by mouth daily. 30 tablet 11  . meloxicam (MOBIC) 7.5 MG tablet Take 7.5 mg by mouth daily.    . pravastatin (PRAVACHOL) 20 MG tablet Take 1 tablet (20 mg total) by mouth daily. 30 tablet 11  . spironolactone (ALDACTONE) 25 MG tablet Take 1 tablet (25 mg total) by mouth daily. 30 tablet 11   No current facility-administered medications for this visit.    Allergies:   Sulfonamide derivatives    Social History:  The patient  reports that he has never smoked. He does not have any smokeless tobacco history on file. He reports that he does not drink alcohol or use illicit drugs. his mother monitors his meds    ROS:  General:no colds or fevers, no weight changes Skin:no rashes or ulcers V:see HPI PUL:see HPI GI:no diarrhea constipation or melena, no indigestion MS:no joint pain, no claudication Neuro:no syncope, no lightheadedness   Wt Readings from Last 3 Encounters:  06/09/15 191 lb 9.6 oz (86.909 kg)  05/27/15 199 lb (90.266 kg)  11/25/14 189 lb 12.8 oz (86.093 kg)     PHYSICAL EXAM: VS:  BP  150/91 mmHg  Pulse 56  Ht 5\' 4"  (1.626 m)  Wt 191 lb 9.6 oz (86.909 kg)  BMI 32.87 kg/m2  SpO2 97% , BMI Body mass index is 32.87 kg/(m^2). General:Pleasant affect, NAD Heart:S1S2 RRR without murmur, gallup, rub or click Lungs:clear without rales, rhonchi, or wheezes Ext:no lower ext edema, 2+ pedal pulses, 2+ radial pulses Neuro:alert and oriented, MAE, follows commands, + facial symmetry    EKG:  EKG is NOT ordered today.   Recent Labs: No results found for requested labs within last 365 days.    Lipid Panel    Component Value Date/Time   CHOL 231* 05/09/2010 2124   TRIG 92 05/09/2010 2124   HDL 43 05/09/2010 2124   CHOLHDL 5.4 Ratio 05/09/2010 2124   VLDL 18 05/09/2010 2124   LDLCALC 170* 05/09/2010 2124        Other studies Reviewed: Additional studies/ records that were reviewed today include: 2014 echo and nuc study.   ASSESSMENT AND PLAN:  1.  HTN --improved from last visit. Will increase the hctz to 25 mg daily.  He does have financial issues and at times they cannot afford the medication.  He is for labs today.   2. Non compliance due to finances  3. Chest wall pain resolved   Current medicines are reviewed with the patient today.  The patient Has no concerns regarding medicines.  The following changes have been made:  See above Labs/ tests ordered today include:see above  Disposition:   FU:  see above  Nyoka LintSigned, INGOLD,LAURA R, NP  06/09/2015 8:31 AM    [facility] [address], [place], [place]  [address]/ [address] [place], [place] Phone: [phone](336) [phone]; Fax: [phone]) [phone]  [phone]

## 2015-06-10 ENCOUNTER — Other Ambulatory Visit: Payer: Self-pay | Admitting: *Deleted

## 2015-06-10 DIAGNOSIS — E785 Hyperlipidemia, unspecified: Secondary | ICD-10-CM

## 2015-06-10 MED ORDER — ATORVASTATIN CALCIUM 20 MG PO TABS: 20.0000 mg | ORAL_TABLET | Freq: Every day | ORAL | Status: DC

## 2015-07-26 ENCOUNTER — Emergency Department (HOSPITAL_COMMUNITY)
Admission: EM | Admit: 2015-07-26 | Discharge: 2015-07-27 | Disposition: A | Discharge status: Home / Self Care | Payer: Medicaid Other | Attending: Emergency Medicine | Admitting: Emergency Medicine

## 2015-07-26 ENCOUNTER — Encounter (HOSPITAL_COMMUNITY): Payer: Self-pay | Admitting: Emergency Medicine

## 2015-07-26 ENCOUNTER — Other Ambulatory Visit: Payer: Medicaid Other

## 2015-07-26 DIAGNOSIS — T783XXA Angioneurotic edema, initial encounter: Secondary | ICD-10-CM | POA: Diagnosis present

## 2015-07-26 DIAGNOSIS — Y9289 Other specified places as the place of occurrence of the external cause: Secondary | ICD-10-CM | POA: Insufficient documentation

## 2015-07-26 DIAGNOSIS — I10 Essential (primary) hypertension: Secondary | ICD-10-CM | POA: Diagnosis not present

## 2015-07-26 DIAGNOSIS — Z791 Long term (current) use of non-steroidal anti-inflammatories (NSAID): Secondary | ICD-10-CM | POA: Diagnosis not present

## 2015-07-26 DIAGNOSIS — Z79899 Other long term (current) drug therapy: Secondary | ICD-10-CM | POA: Insufficient documentation

## 2015-07-26 DIAGNOSIS — Z7982 Long term (current) use of aspirin: Secondary | ICD-10-CM | POA: Insufficient documentation

## 2015-07-26 DIAGNOSIS — T464X5A Adverse effect of angiotensin-converting-enzyme inhibitors, initial encounter: Secondary | ICD-10-CM | POA: Diagnosis not present

## 2015-07-26 DIAGNOSIS — X58XXXA Exposure to other specified factors, initial encounter: Secondary | ICD-10-CM | POA: Diagnosis not present

## 2015-07-26 DIAGNOSIS — Y998 Other external cause status: Secondary | ICD-10-CM | POA: Diagnosis not present

## 2015-07-26 DIAGNOSIS — Y9389 Activity, other specified: Secondary | ICD-10-CM | POA: Insufficient documentation

## 2015-07-26 MED ORDER — DIPHENHYDRAMINE HCL 25 MG PO CAPS
25.0000 mg | ORAL_CAPSULE | Freq: Once | ORAL | Status: AC
  Administered 2015-07-27: 25 mg via ORAL
  Filled 2015-07-26: qty 1

## 2015-07-26 MED ORDER — DEXAMETHASONE SODIUM PHOSPHATE 10 MG/ML IJ SOLN
10.0000 mg | Freq: Once | INTRAMUSCULAR | Status: AC
  Administered 2015-07-27: 10 mg via INTRAMUSCULAR
  Filled 2015-07-26: qty 1

## 2015-07-26 MED ORDER — FAMOTIDINE 20 MG PO TABS
20.0000 mg | ORAL_TABLET | Freq: Once | ORAL | Status: AC
  Administered 2015-07-27: 20 mg via ORAL
  Filled 2015-07-26: qty 1

## 2015-07-26 NOTE — ED Notes (Signed)
Patient here from home with complaints of facial swelling that started around 2pm. Hx of lisinopril over the past year. Swelling noted to upper lip. Breathing freely. No other complaints. 98% RA

## 2015-07-26 NOTE — ED Provider Notes (Signed)
CSN: [id number]     Arrival date & time 07/26/15  2343 History  By signing my name below, I, Tanda Rockers, attest that this documentation has been prepared under the direction and in the presence of Donavyn Fecher, MD. Electronically Signed: Tanda Rockers, ED Scribe. 07/26/2015. 11:59 PM.   Chief Complaint  Patient presents with  . Angioedema   Patient is a 55 y.o. male presenting with allergic reaction. The history is provided by the patient and the spouse. No language interpreter was used.  Allergic Reaction Presenting symptoms: swelling   Swelling:    Location: upper lip.   Onset quality:  Sudden   Duration:  8 hours   Timing:  Constant   Progression:  Unchanged   Chronicity:  New Severity:  Mild Prior allergic episodes:  No prior episodes Context: medications   Relieved by:  Nothing Worsened by:  Nothing tried Ineffective treatments:  None tried    HPI Comments: Travis Barrera is a 55 y.o. male with hx HTN who presents to the Emergency Department complaining of gradual onset, constant, upper lip swelling that began 8 hours ago. Wife reports that the swelling has been unchanged since onset. Wife mentions that pt has been on Lisinopril for the past year without issue. Denies shortness of breath, tongue swelling, throat swelling, or any other associated symptoms.   Past Medical History  Diagnosis Date  . Hypertension    Past Surgical History  Procedure Laterality Date  . Appendectomy     Family History  Problem Relation Age of Onset  . Hypertension Mother   . Heart disease Maternal Grandmother   . Heart attack Maternal Grandmother   . Stroke Neg Hx    Social History  Substance Use Topics  . Smoking status: Never Smoker   . Smokeless tobacco: None  . Alcohol Use: No    Review of Systems  HENT: Positive for facial swelling.        Negative for throat swelling Negative for tongue swelling  Respiratory: Negative for shortness of breath.   All other systems reviewed  and are negative.  Allergies  Sulfonamide derivatives  Home Medications   Prior to Admission medications   Medication Sig Start Date End Date Taking? Authorizing Provider  amLODipine (NORVASC) 10 MG tablet Take 1 tablet (10 mg total) by mouth daily. 06/09/15   Leone Brand, NP  aspirin 325 MG tablet Take 325 mg by mouth daily. For arthritis pain    Historical Provider, MD  atorvastatin (LIPITOR) 20 MG tablet Take 1 tablet (20 mg total) by mouth daily. 06/10/15   Leone Brand, NP  benzonatate (TESSALON) 100 MG capsule Take 100 mg by mouth 2 (two) times daily.  03/24/14   Historical Provider, MD  carvedilol (COREG) 6.25 MG tablet Take 1 tablet (6.25 mg total) by mouth 2 (two) times daily. 06/09/15   Leone Brand, NP  cetirizine (ZYRTEC) 10 MG tablet Take 10 mg by mouth daily.    Historical Provider, MD  cloNIDine (CATAPRES) 0.1 MG tablet Take 1 tablet (0.1 mg total) by mouth 2 (two) times daily. 05/27/15   Leone Brand, NP  guaifenesin (ROBITUSSIN) 100 MG/5ML syrup Take 100-200 mg by mouth every 4 (four) hours as needed for cough or congestion.    Historical Provider, MD  hydrochlorothiazide (HYDRODIURIL) 25 MG tablet Take 1 tablet (25 mg total) by mouth daily. 06/09/15   Leone Brand, NP  HYDROcodone-acetaminophen (HYCET) 7.5-325 mg/15 ml solution Take 15 mLs by mouth at  bedtime as needed for moderate pain or severe pain.    Historical Provider, MD  lisinopril (PRINIVIL,ZESTRIL) 40 MG tablet Take 1 tablet (40 mg total) by mouth daily. 06/09/15   Leone Brand, NP  meloxicam (MOBIC) 7.5 MG tablet Take 7.5 mg by mouth daily.    Historical Provider, MD  spironolactone (ALDACTONE) 25 MG tablet Take 1 tablet (25 mg total) by mouth daily. 06/09/15   Leone Brand, NP   BP 144/96 mmHg  Pulse 63  Temp(Src) 97.9 F (36.6 C) (Oral)  Resp 16  SpO2 98%   Physical Exam  Constitutional: He is oriented to person, place, and time. He appears well-developed and well-nourished. No distress.   HENT:  Head: Normocephalic and atraumatic.  Mouth/Throat: Uvula is midline and mucous membranes are normal. No oropharyngeal exudate.  No swelling of the tongue or uvula, mild swelling of the upper lip  Airway patent  Eyes: Conjunctivae and EOM are normal. Pupils are equal, round, and reactive to light.  Neck: Normal range of motion. Neck supple. Carotid bruit is not present. No tracheal deviation present.  Cardiovascular: Normal rate, regular rhythm and normal heart sounds.   Pulmonary/Chest: Effort normal and breath sounds normal. No stridor. No respiratory distress. He has no wheezes. He has no rales.  Abdominal: Soft. Bowel sounds are normal. There is no tenderness. There is no rebound and no guarding.  Musculoskeletal: Normal range of motion.  Neurological: He is alert and oriented to person, place, and time.  Skin: Skin is warm and dry.  Psychiatric: He has a normal mood and affect. His behavior is normal.  Nursing note and vitals reviewed.   ED Course  Procedures (including critical care time)  DIAGNOSTIC STUDIES: Oxygen Saturation is 98% on RA, normal by my interpretation.    COORDINATION OF CARE: 11:55 PM-Discussed treatment plan which includes Decadron, Benadryl, and Pepcid with pt at bedside and pt agreed to plan.   Labs Review Labs Reviewed - No data to display  Imaging Review No results found.    EKG Interpretation None      MDM   Final diagnoses:  None   Feeling better post medication.  List lisinopril as an allergy.  Stop this medication immediately and inform your doctor you must change to a different class of medication.  Patient and wife verbalize understanding and agree to follow up   Observed in the ED with improvement of symptoms.  Patient stated to nurse he feels better and wants to go home.    I personally performed the services described in this documentation, which was scribed in my presence. The recorded information has been reviewed and is  accurate.        Cy Blamer, MD 07/27/15 [phone]

## 2015-07-27 ENCOUNTER — Encounter (HOSPITAL_COMMUNITY): Payer: Self-pay | Admitting: Emergency Medicine

## 2015-07-27 NOTE — Discharge Instructions (Signed)
Angioedema  Angioedema is a sudden swelling of tissues, often of the skin. It can occur on the face or genitals or in the abdomen or other body parts. The swelling usually develops over a short period and gets better in 24 to 48 hours. It often begins during the night and is found when the person wakes up. The person may also get red, itchy patches of skin (hives). Angioedema can be dangerous if it involves swelling of the air passages.   Depending on the cause, episodes of angioedema may only happen once, come back in unpredictable patterns, or repeat for several years and then gradually fade away.   CAUSES   Angioedema can be caused by an allergic reaction to various triggers. It can also result from nonallergic causes, including reactions to drugs, immune system disorders, viral infections, or an abnormal gene that is passed to you from your parents (hereditary). For some people with angioedema, the cause is unknown.   Some things that can trigger angioedema include:    Foods.    Medicines, such as ACE inhibitors, ARBs, nonsteroidal anti-inflammatory agents, or estrogen.    Latex.    Animal saliva.    Insect stings.    Dyes used in X-rays.    Mild injury.    Dental work.   Surgery.   Stress.    Sudden changes in temperature.    Exercise.  SIGNS AND SYMPTOMS    Swelling of the skin.   Hives. If these are present, there is also intense itching.   Redness in the affected area.    Pain in the affected area.   Swollen lips or tongue.   Breathing problems. This may happen if the air passages swell.   Wheezing.  If internal organs are involved, there may be:    Nausea.    Abdominal pain.    Vomiting.    Difficulty swallowing.    Difficulty passing urine.  DIAGNOSIS    Your health care provider will examine the affected area and take a medical and family history.   Various tests may be done to help determine the cause. Tests may include:   Allergy skin tests to see if the problem  is an allergic reaction.    Blood tests to check for hereditary angioedema.    Tests to check for underlying diseases that could cause the condition.    A review of your medicines, including over-the-counter medicines, may be done.  TREATMENT   Treatment will depend on the cause of the angioedema. Possible treatments include:    Removal of anything that triggered the condition (such as stopping certain medicines).    Medicines to treat symptoms or prevent attacks. Medicines given may include:     Antihistamines.     Epinephrine injection.     Steroids.    Hospitalization may be required for severe attacks. If the air passages are affected, it can be an emergency. Tubes may need to be placed to keep the airway open.  HOME CARE INSTRUCTIONS    Take all medicines as directed by your health care provider.   If you were given medicines for emergency allergy treatment, always carry them with you.   Wear a medical bracelet as directed by your health care provider.    Avoid known triggers.  SEEK MEDICAL CARE IF:    You have repeat attacks of angioedema.    Your attacks are more frequent or more severe despite preventive measures.    You have hereditary angioedema   and are considering having children. It is important to discuss with your health care provider the risks of passing the condition on to your children.  SEEK IMMEDIATE MEDICAL CARE IF:    You have severe swelling of the mouth, tongue, or lips.   You have difficulty breathing.    You have difficulty swallowing.    You faint.  MAKE SURE YOU:   Understand these instructions.   Will watch your condition.   Will get help right away if you are not doing well or get worse.     This information is not intended to replace advice given to you by your health care provider. Make sure you discuss any questions you have with your health care provider.     Document Released: 08/07/2001 Document Revised: 06/19/2014 Document Reviewed:  01/20/2013  Elsevier Interactive Patient Education 2016 [employer]

## 2015-08-30 ENCOUNTER — Ambulatory Visit: Payer: Medicaid Other | Admitting: Cardiology

## 2015-09-01 ENCOUNTER — Encounter: Payer: Self-pay | Admitting: Cardiology

## 2015-09-01 ENCOUNTER — Ambulatory Visit (INDEPENDENT_AMBULATORY_CARE_PROVIDER_SITE_OTHER): Payer: Medicaid Other | Admitting: Cardiology

## 2015-09-01 VITALS — BP 168/92 | HR 72 | Ht 64.0 in | Wt 190.0 lb

## 2015-09-01 DIAGNOSIS — Z9119 Patient's noncompliance with other medical treatment and regimen: Secondary | ICD-10-CM | POA: Diagnosis not present

## 2015-09-01 DIAGNOSIS — I10 Essential (primary) hypertension: Secondary | ICD-10-CM | POA: Diagnosis not present

## 2015-09-01 DIAGNOSIS — Z91199 Patient's noncompliance with other medical treatment and regimen due to unspecified reason: Secondary | ICD-10-CM

## 2015-09-01 MED ORDER — HYDROCHLOROTHIAZIDE 25 MG PO TABS: 25.0000 mg | ORAL_TABLET | Freq: Every day | ORAL | Status: DC

## 2015-09-01 MED ORDER — CLONIDINE HCL 0.1 MG PO TABS: 0.1000 mg | ORAL_TABLET | Freq: Two times a day (BID) | ORAL | Status: DC

## 2015-09-01 NOTE — Patient Instructions (Addendum)
Your physician has recommended you make the following change in your medication:  Please pick up from the [facility] and begin taking   1.) clonidine 0.1 mg two times a day (for blood pressure) 2.) hydrochlorothiazide (HCTZ) 25 mg once a day (for blood pressure)  Your physician recommends that you schedule a follow-up appointment as needed with Dr. Anne FuSkains. Dr. Anne FuSkains has recommended that you see a primary care physician.  Our office will contact you with further information on who to call.

## 2015-09-01 NOTE — Progress Notes (Signed)
[address] [place], [place]  [address] Phone: [phone]) [phone] Fax:  [phone]  Date:  09/01/2015   ID:  Travis PlumJames Samples, DOB 10/16/1960, MRN [id number]  PCP:  Steele BergNancy W Phifer, MD   History of Present Illness: Travis Barrera is a 55 y.o. male here for follow up of hypertension. Unfortunately had a reaction of angioedema felt to be secondary to ACE inhibitor and went to emergency room on 07/26/15 because of this. He had sudden onset upper lip swelling. He received Decadron. Blood pressure has been as high as 270 systolic recently.   [facility] was seen at [facility] and had severely elevated blood pressure and was sent to the emergency room. Denies any chest pain, visual symptoms, strokelike symptoms.  Mother has CAD stent placed.  Has been on amlodipine. Previously had not been taking his clonidine.  He was seen previously by Nada BoozerLaura Ingold, NP and his HCTZ was increased to 25 mg once a day.  Spironolactone and carvedilol were added.  He feels well, no symptoms. No chest pain, no shortness of breath.   Nuclear stress test on 09/17/12 was low risk, no ischemia, normal EF Echocardiogram on 09/17/12 showed normal ejection fraction, mild left ventricular hypertrophy, diastolic dysfunction, trace valvular lesions.   Wt Readings from Last 3 Encounters:  09/01/15 190 lb (86.183 kg)  06/09/15 191 lb 9.6 oz (86.909 kg)  05/27/15 199 lb (90.266 kg)     Past Medical History  Diagnosis Date  . Hypertension     Past Surgical History  Procedure Laterality Date  . Appendectomy      Current Outpatient Prescriptions  Medication Sig Dispense Refill  . amLODipine (NORVASC) 10 MG tablet Take 1 tablet (10 mg total) by mouth daily. 30 tablet 11  . aspirin 325 MG tablet Take 325 mg by mouth daily. For arthritis pain    . atorvastatin (LIPITOR) 20 MG tablet Take 1 tablet (20 mg total) by mouth daily. 30 tablet 11  . benzonatate (TESSALON) 100 MG capsule Take 100 mg by mouth  2 (two) times daily.   0  . carvedilol (COREG) 6.25 MG tablet Take 1 tablet (6.25 mg total) by mouth 2 (two) times daily. 60 tablet 11  . cetirizine (ZYRTEC) 10 MG tablet Take 10 mg by mouth daily.    . cloNIDine (CATAPRES) 0.1 MG tablet Take 1 tablet (0.1 mg total) by mouth 2 (two) times daily. 60 tablet 11  . guaifenesin (ROBITUSSIN) 100 MG/5ML syrup Take 100-200 mg by mouth every 4 (four) hours as needed for cough or congestion.    . hydrochlorothiazide (HYDRODIURIL) 25 MG tablet Take 1 tablet (25 mg total) by mouth daily. 30 tablet 11  . HYDROcodone-acetaminophen (HYCET) 7.5-325 mg/15 ml solution Take 15 mLs by mouth at bedtime as needed for moderate pain or severe pain.    . meloxicam (MOBIC) 7.5 MG tablet Take 7.5 mg by mouth daily.    Marland Kitchen. spironolactone (ALDACTONE) 25 MG tablet Take 1 tablet (25 mg total) by mouth daily. 30 tablet 11   No current facility-administered medications for this visit.    Allergies:    Allergies  Allergen Reactions  . Lisinopril Swelling    Caused lips and face to swell  . Ace Inhibitors Swelling    Caused his lips to swell  . Sulfonamide Derivatives     unknown    Social History:  The patient  reports that he has never smoked. He does not have  any smokeless tobacco history on file. He reports that he does not drink alcohol or use illicit drugs.   Family History  Problem Relation Age of Onset  . Hypertension Mother   . Heart disease Maternal Grandmother   . Heart attack Maternal Grandmother   . Stroke Neg Hx     ROS:  Please see the history of present illness.   Denies any fevers, chills, orthopnea, PND   All other systems reviewed and negative.   PHYSICAL EXAM: VS:  BP 168/92 mmHg  Pulse 72  Ht  (1.626 m)  Wt 190 lb (86.183 kg)  BMI 32.60 kg/m2 Well nourished, well developed, in no acute distress HEENT: normal, [place]/AT, EOMI Neck: no JVD, normal carotid upstroke, no bruit Cardiac:  normal S1, S2; RRR; no murmur Lungs:  clear to  auscultation bilaterally, no wheezing, rhonchi or rales Abd: soft, nontender, no hepatomegaly, no bruits Ext: no edema, 2+ distal pulses Skin: warm and dry GU: deferred Neuro: no focal abnormalities noted, AAO x 3  EKG:  Sinus rhythm, T-wave inversion inferior lateral leads. 09/17/12. Heart rate 74 bpm.    Labs: 04/03/14-Creatinine 1.28, potassium 3.9, hemoglobin 13.1  ASSESSMENT AND PLAN:  1. Difficult to control hypertension/uncontrolled-improved currently. Previously Added spironolactone 25 mg once a day as well as carvedilol 6.25 mg twice a day. Cannot increase carvedilol because of bradycardia. I will re-prescribe  clonidine 0.1 mg twice a day. I'm not convinced either that he is taking his HCTZ 25 mg once a day. He did not have that bottle with him as well. Prior basic metabolic profile after using Aldactone showed no evidence of hyperkalemia. Currently he is without symptoms, no chest pain, no visual disturbance, no strokelike symptoms, no shortness of breath. Try to limit NSAIDs. 2. Medical noncompliance. He has been out of several of his medications. Abnormal EKG-chronic changes. Echocardiogram, stress test reassuring mild LVH. 3. PRN follow-up. He will follow-up with primary care physician for his future needs. The one currently listed, Alison Murray, he has not seen in years since she has moved to [facility]. Perhaps he can get established with our residency clinic or community health and wellness. This will also help him with any seasonal allergies that he may be having trouble with his wife brings up.  Signed, Donato Schultz, MD [facility]  09/01/2015 8:16 AM

## 2015-10-18 ENCOUNTER — Encounter (HOSPITAL_COMMUNITY): Payer: Self-pay | Admitting: Emergency Medicine

## 2015-10-18 ENCOUNTER — Ambulatory Visit (HOSPITAL_COMMUNITY)
Admission: EM | Admit: 2015-10-18 | Discharge: 2015-10-18 | Disposition: A | Discharge status: Home / Self Care | Payer: Medicaid Other | Attending: Emergency Medicine | Admitting: Emergency Medicine

## 2015-10-18 DIAGNOSIS — R0981 Nasal congestion: Secondary | ICD-10-CM | POA: Diagnosis not present

## 2015-10-18 NOTE — ED Notes (Signed)
Patient complains of "allergies", stuffy nose and congested head.  Patient denies pain, denies chest congestion, denies fever.

## 2015-10-18 NOTE — ED Provider Notes (Signed)
CSN: [id number]     Arrival date & time 10/18/15  1314 History   First MD Initiated Contact with Patient 10/18/15 1329     Chief Complaint  Patient presents with  . URI   (Consider location/radiation/quality/duration/timing/severity/associated sxs/prior Treatment) HPI Comments: Pt st that she needed to leave to catch a bus and could not wait any longer. She made a comment that she would get some allergy medicine at the drug store for her husband   Patient is a 55 y.o. male presenting with URI.  URI   Past Medical History  Diagnosis Date  . Hypertension    Past Surgical History  Procedure Laterality Date  . Appendectomy     Family History  Problem Relation Age of Onset  . Hypertension Mother   . Heart disease Maternal Grandmother   . Heart attack Maternal Grandmother   . Stroke Neg Hx    Social History  Substance Use Topics  . Smoking status: Never Smoker   . Smokeless tobacco: None  . Alcohol Use: No    Review of Systems  Allergies  Lisinopril; Ace inhibitors; and Sulfonamide derivatives  Home Medications   Prior to Admission medications   Medication Sig Start Date End Date Taking? Authorizing Provider  amLODipine (NORVASC) 10 MG tablet Take 1 tablet (10 mg total) by mouth daily. 06/09/15   Leone Brand, NP  aspirin 325 MG tablet Take 325 mg by mouth daily. For arthritis pain    Historical Provider, MD  atorvastatin (LIPITOR) 20 MG tablet Take 1 tablet (20 mg total) by mouth daily. 06/10/15   Leone Brand, NP  benzonatate (TESSALON) 100 MG capsule Take 100 mg by mouth 2 (two) times daily.  03/24/14   Historical Provider, MD  carvedilol (COREG) 6.25 MG tablet Take 1 tablet (6.25 mg total) by mouth 2 (two) times daily. 06/09/15   Leone Brand, NP  cetirizine (ZYRTEC) 10 MG tablet Take 10 mg by mouth daily.    Historical Provider, MD  cloNIDine (CATAPRES) 0.1 MG tablet Take 1 tablet (0.1 mg total) by mouth 2 (two) times daily. 09/01/15   Jake Bathe, MD   guaifenesin (ROBITUSSIN) 100 MG/5ML syrup Take 100-200 mg by mouth every 4 (four) hours as needed for cough or congestion.    Historical Provider, MD  hydrochlorothiazide (HYDRODIURIL) 25 MG tablet Take 1 tablet (25 mg total) by mouth daily. 09/01/15   Jake Bathe, MD  HYDROcodone-acetaminophen (HYCET) 7.5-325 mg/15 ml solution Take 15 mLs by mouth at bedtime as needed for moderate pain or severe pain.    Historical Provider, MD  meloxicam (MOBIC) 7.5 MG tablet Take 7.5 mg by mouth daily.    Historical Provider, MD  spironolactone (ALDACTONE) 25 MG tablet Take 1 tablet (25 mg total) by mouth daily. 06/09/15   Leone Brand, NP   Meds Ordered and Administered this Visit  Medications - No data to display  BP 129/90 mmHg  Pulse 60  Temp(Src) 97.9 F (36.6 C) (Oral)  Resp 16  SpO2 99% No data found.   Physical Exam  ED Course  Procedures (including critical care time)  Labs Review Labs Reviewed - No data to display  Imaging Review No results found.   Visual Acuity Review  Right Eye Distance:   Left Eye Distance:   Bilateral Distance:    Right Eye Near:   Left Eye Near:    Bilateral Near:         MDM  No diagnosis found.  Pt left without being seen, wife needed to catch a bus.    Hayden Rasmussenavid Tramar Brueckner, NP 10/18/15 1429  Hayden Rasmussenavid Adante Courington, NP 10/18/15 1430

## 2015-10-18 NOTE — ED Notes (Signed)
Patient and family member reported they must leave.  Their ride is leaving.

## 2015-11-28 ENCOUNTER — Other Ambulatory Visit: Payer: Self-pay | Admitting: Cardiology

## 2015-12-08 ENCOUNTER — Encounter (HOSPITAL_COMMUNITY): Payer: Self-pay | Admitting: Emergency Medicine

## 2015-12-08 ENCOUNTER — Emergency Department (HOSPITAL_COMMUNITY)
Admission: EM | Admit: 2015-12-08 | Discharge: 2015-12-08 | Disposition: A | Discharge status: Home / Self Care | Payer: Medicaid Other | Attending: Emergency Medicine | Admitting: Emergency Medicine

## 2015-12-08 DIAGNOSIS — Z79899 Other long term (current) drug therapy: Secondary | ICD-10-CM | POA: Insufficient documentation

## 2015-12-08 DIAGNOSIS — Z7982 Long term (current) use of aspirin: Secondary | ICD-10-CM | POA: Insufficient documentation

## 2015-12-08 DIAGNOSIS — I10 Essential (primary) hypertension: Secondary | ICD-10-CM | POA: Insufficient documentation

## 2015-12-08 LAB — CBC
HEMATOCRIT: 38.7 % — AB (ref 39.0–52.0)
Hemoglobin: 12.9 g/dL — ABNORMAL LOW (ref 13.0–17.0)
MCH: 34.6 pg — AB (ref 26.0–34.0)
MCHC: 33.3 g/dL (ref 30.0–36.0)
MCV: 103.8 fL — AB (ref 78.0–100.0)
Platelets: 450 10*3/uL — ABNORMAL HIGH (ref 150–400)
RBC: 3.73 MIL/uL — ABNORMAL LOW (ref 4.22–5.81)
RDW: 12.7 % (ref 11.5–15.5)
WBC: 5.2 10*3/uL (ref 4.0–10.5)

## 2015-12-08 LAB — BASIC METABOLIC PANEL
Anion gap: 6 (ref 5–15)
BUN: 11 mg/dL (ref 6–20)
CALCIUM: 9.2 mg/dL (ref 8.9–10.3)
CO2: 28 mmol/L (ref 22–32)
CREATININE: 1.41 mg/dL — AB (ref 0.61–1.24)
Chloride: 102 mmol/L (ref 101–111)
GFR calc Af Amer: >60 mL/min (ref >60)
GFR, EST NON AFRICAN AMERICAN: 54 mL/min — AB (ref >60)
GLUCOSE: 150 mg/dL — AB (ref 65–99)
Potassium: 3.9 mmol/L (ref 3.5–5.1)
Sodium: 136 mmol/L (ref 135–145)

## 2015-12-08 MED ORDER — CLONIDINE HCL 0.2 MG PO TABS: 0.2000 mg | ORAL_TABLET | Freq: Two times a day (BID) | ORAL | Status: DC

## 2015-12-08 MED ORDER — CLONIDINE HCL 0.2 MG PO TABS
0.2000 mg | ORAL_TABLET | Freq: Once | ORAL | Status: AC
  Administered 2015-12-08: 0.2 mg via ORAL
  Filled 2015-12-08: qty 1

## 2015-12-08 NOTE — ED Notes (Signed)
Pt being sent by PCP d/t hypertension.  Pt is noncompliant w/ medications.  Hx of MR and cared for by elderly mother.  PCP reports that they have called Case Management/Social Work to try to set-up Home Health.

## 2015-12-08 NOTE — Discharge Instructions (Signed)
You are receiving a new prescription for your clonidine, 0.2 mg twice per day.  Take this instead of your prior dose of 0.1 mg twice per day. Continue taking spironolactone at current dose   Hypertension Hypertension, commonly called high blood pressure, is when the force of blood pumping through your arteries is too strong. Your arteries are the blood vessels that carry blood from your heart throughout your body. A blood pressure reading consists of a higher number over a lower number, such as 110/72. The higher number (systolic) is the pressure inside your arteries when your heart pumps. The lower number (diastolic) is the pressure inside your arteries when your heart relaxes. Ideally you want your blood pressure below 120/80. Hypertension forces your heart to work harder to pump blood. Your arteries may become narrow or stiff. Having untreated or uncontrolled hypertension can cause heart attack, stroke, kidney disease, and other problems. RISK FACTORS Some risk factors for high blood pressure are controllable. Others are not.  Risk factors you cannot control include:   Race. You may be at higher risk if you are African American.  Age. Risk increases with age.  Gender. Men are at higher risk than women before age 56 years. After age 56, women are at higher risk than men. Risk factors you can control include:  Not getting enough exercise or physical activity.  Being overweight.  Getting too much fat, sugar, calories, or salt in your diet.  Drinking too much alcohol. SIGNS AND SYMPTOMS Hypertension does not usually cause signs or symptoms. Extremely high blood pressure (hypertensive crisis) may cause headache, anxiety, shortness of breath, and nosebleed. DIAGNOSIS To check if you have hypertension, your health care provider will measure your blood pressure while you are seated, with your arm held at the level of your heart. It should be measured at least twice using the same arm. Certain  conditions can cause a difference in blood pressure between your right and left arms. A blood pressure reading that is higher than normal on one occasion does not mean that you need treatment. If it is not clear whether you have high blood pressure, you may be asked to return on a different day to have your blood pressure checked again. Or, you may be asked to monitor your blood pressure at home for 1 or more weeks. TREATMENT Treating high blood pressure includes making lifestyle changes and possibly taking medicine. Living a healthy lifestyle can help lower high blood pressure. You may need to change some of your habits. Lifestyle changes may include:  Following the DASH diet. This diet is high in fruits, vegetables, and whole grains. It is low in salt, red meat, and added sugars.  Keep your sodium intake below 2,300 mg per day.  Getting at least 30-45 minutes of aerobic exercise at least 4 times per week.  Losing weight if necessary.  Not smoking.  Limiting alcoholic beverages.  Learning ways to reduce stress. Your health care provider may prescribe medicine if lifestyle changes are not enough to get your blood pressure under control, and if one of the following is true:  You are 3518-56 years of age and your systolic blood pressure is above 140.  You are 56 years of age or older, and your systolic blood pressure is above 150.  Your diastolic blood pressure is above 90.  You have diabetes, and your systolic blood pressure is over 140 or your diastolic blood pressure is over 90.  You have kidney disease and your blood  pressure is above 140/90.  You have heart disease and your blood pressure is above 140/90. Your personal target blood pressure may vary depending on your medical conditions, your age, and other factors. HOME CARE INSTRUCTIONS  Have your blood pressure rechecked as directed by your health care provider.   Take medicines only as directed by your health care provider.  Follow the directions carefully. Blood pressure medicines must be taken as prescribed. The medicine does not work as well when you skip doses. Skipping doses also puts you at risk for problems.  Do not smoke.   Monitor your blood pressure at home as directed by your health care provider. SEEK MEDICAL CARE IF:   You think you are having a reaction to medicines taken.  You have recurrent headaches or feel dizzy.  You have swelling in your ankles.  You have trouble with your vision. SEEK IMMEDIATE MEDICAL CARE IF:  You develop a severe headache or confusion.  You have unusual weakness, numbness, or feel faint.  You have severe chest or abdominal pain.  You vomit repeatedly.  You have trouble breathing. MAKE SURE YOU:   Understand these instructions.  Will watch your condition.  Will get help right away if you are not doing well or get worse.   This information is not intended to replace advice given to you by your health care provider. Make sure you discuss any questions you have with your health care provider.   Document Released: 05/29/2005 Document Revised: 10/13/2014 Document Reviewed: 03/21/2013 Elsevier Interactive Patient Education [employer]2016 Elsevier Inc.

## 2015-12-08 NOTE — ED Provider Notes (Addendum)
CSN: [id number]     Arrival date & time 12/08/15  1738 History   First MD Initiated Contact with Patient 12/08/15 1915     Chief Complaint  Patient presents with  . Hypertension     HPI  She presents for evaluation of high blood pressure. Seen in his primary care physician's office today. Routine follow-up. Blood pressure is over 200 systolic he was referred here. Was given clonidine 0.1 milligrams by mouth prior to being sent over. He is a symptomatic. He does not have chest pain or shortness of breath. Does not have stroke symptoms. He is making urine. He does not have extremity swelling. Is not lightheaded or dizzy or symptomatic.  Is not having exertional chest pain or dyspnea. Signed PND orthopnea. He is uncertain about the medicines he takes. He can tell me "takes "the pink one twice a day, and the yellow one once a day".  Was able to have heart pharmacy tech call his [facility] pharmacy and determined that he is taking 0.1 twice a day of clonidine, and 25 of spironolactone daily. Not taking Coreg, or amlodipine.  Past Medical History  Diagnosis Date  . Hypertension    Past Surgical History  Procedure Laterality Date  . Appendectomy     Family History  Problem Relation Age of Onset  . Hypertension Mother   . Heart disease Maternal Grandmother   . Heart attack Maternal Grandmother   . Stroke Neg Hx    Social History  Substance Use Topics  . Smoking status: Never Smoker   . Smokeless tobacco: None  . Alcohol Use: No    Review of Systems  Constitutional: Negative for fever, chills, diaphoresis, appetite change and fatigue.  HENT: Negative for mouth sores, sore throat and trouble swallowing.   Eyes: Negative for visual disturbance.  Respiratory: Negative for cough, chest tightness, shortness of breath and wheezing.   Cardiovascular: Negative for chest pain.  Gastrointestinal: Negative for nausea, vomiting, abdominal pain, diarrhea and abdominal distention.  Endocrine:  Negative for polydipsia, polyphagia and polyuria.  Genitourinary: Negative for dysuria, frequency and hematuria.  Musculoskeletal: Negative for gait problem.  Skin: Negative for color change, pallor and rash.  Neurological: Negative for dizziness, syncope, light-headedness and headaches.  Hematological: Does not bruise/bleed easily.  Psychiatric/Behavioral: Negative for behavioral problems and confusion.      Allergies  Lisinopril; Ace inhibitors; and Sulfonamide derivatives  Home Medications   Prior to Admission medications   Medication Sig Start Date End Date Taking? Authorizing Provider  acetaminophen (TYLENOL) 500 MG tablet Take 500 mg by mouth 2 (two) times daily as needed for mild pain.   Yes Historical Provider, MD  aspirin 325 MG tablet Take 325 mg by mouth daily as needed (for arthritis pain).    Yes Historical Provider, MD  carvedilol (COREG) 6.25 MG tablet Take 6.25 mg by mouth 2 (two) times daily with a meal.   Yes Historical Provider, MD  cetirizine (ZYRTEC) 10 MG tablet Take 10 mg by mouth daily as needed for allergies.    Yes Historical Provider, MD  spironolactone (ALDACTONE) 25 MG tablet Take 25 mg by mouth daily.   Yes Historical Provider, MD  amLODipine (NORVASC) 10 MG tablet Take 1 tablet (10 mg total) by mouth daily. Patient not taking: Reported on 12/08/2015 06/09/15   Leone BrandLaura R Ingold, NP  atorvastatin (LIPITOR) 20 MG tablet Take 1 tablet (20 mg total) by mouth daily. Patient not taking: Reported on 12/08/2015 06/10/15   Leone BrandLaura R Ingold, NP  cloNIDine (CATAPRES) 0.2 MG tablet Take 1 tablet (0.2 mg total) by mouth 2 (two) times daily. 12/08/15   Rolland PorterMark Tommie, MD  hydrochlorothiazide (HYDRODIURIL) 25 MG tablet Take 1 tablet (25 mg total) by mouth daily. Patient not taking: Reported on 12/08/2015 09/01/15   Jake BatheMark C Skains, MD   BP 192/92 mmHg  Pulse 60  Temp(Src) 98 F (36.7 C) (Oral)  Resp 16  Ht 5\' 6"  (1.676 m)  Wt 191 lb (86.637 kg)  BMI 30.84 kg/m2  SpO2  100% Physical Exam  Constitutional: He is oriented to person, place, and time. He appears well-developed and well-nourished. No distress.  HENT:  Head: Normocephalic.  Eyes: Conjunctivae are normal. Pupils are equal, round, and reactive to light. No scleral icterus.  Neck: Normal range of motion. Neck supple. No thyromegaly present.  Cardiovascular: Normal rate and regular rhythm.  Exam reveals no gallop and no friction rub.   No murmur heard. Pulmonary/Chest: Effort normal and breath sounds normal. No respiratory distress. He has no wheezes. He has no rales.  Abdominal: Soft. Bowel sounds are normal. He exhibits no distension. There is no tenderness. There is no rebound.  Musculoskeletal: Normal range of motion.  Neurological: He is alert and oriented to person, place, and time.  Skin: Skin is warm and dry. No rash noted.  Psychiatric: He has a normal mood and affect. His behavior is normal.    ED Course  Procedures (including critical care time) Labs Review Labs Reviewed  BASIC METABOLIC PANEL - Abnormal; Notable for the following:    Glucose, Bld 150 (*)    Creatinine, Ser 1.41 (*)    GFR calc non Af Amer 54 (*)    All other components within normal limits  CBC - Abnormal; Notable for the following:    RBC 3.73 (*)    Hemoglobin 12.9 (*)    HCT 38.7 (*)    MCV 103.8 (*)    MCH 34.6 (*)    Platelets 450 (*)    All other components within normal limits    Imaging Review No results found. I have personally reviewed and evaluated these images and lab results as part of my medical decision-making.   EKG Interpretation None      MDM   Final diagnoses:  Essential hypertension   EKG unable to be placed in [facility]. However comparison made a 2015 shows findings consistent with LVH. Does have repolarization abnormality in 1 and L that are similar appearance to a tracing from 2015.  Has slight elevation of creatinine consistent with his prior readings as well as. Made aware of  the subtle findings consistent with some end organ damage. Both of which seem chronic. I had pharmacy review his medications and contact his pharmacy. We are able to ascertain that he is taking clonidine 0.1 twice a day, and Spiriva lactone 25 daily. We will increase his clonidine to 0.2 twice a day. This is worked well. His pressure here is down to 170/93.      Rolland PorterMark Kawon, MD 12/08/15 2040  Rolland PorterMark Elihue, MD 12/08/15 [phone]

## 2015-12-08 NOTE — ED Notes (Signed)
[facility]. Went for regular check-up today. Denies pain on arrival. States prior to coming to ED he was given a BP pill but unsure what he was given. Alert and oriented

## 2016-06-09 ENCOUNTER — Other Ambulatory Visit: Payer: Self-pay | Admitting: Cardiology

## 2016-06-10 ENCOUNTER — Encounter (HOSPITAL_COMMUNITY): Payer: Self-pay

## 2016-06-10 ENCOUNTER — Emergency Department (HOSPITAL_COMMUNITY)
Admission: EM | Admit: 2016-06-10 | Discharge: 2016-06-10 | Disposition: A | Discharge status: Home / Self Care | Payer: Medicaid Other | Attending: Physician Assistant | Admitting: Physician Assistant

## 2016-06-10 ENCOUNTER — Emergency Department (HOSPITAL_COMMUNITY): Payer: Medicaid Other

## 2016-06-10 DIAGNOSIS — Y929 Unspecified place or not applicable: Secondary | ICD-10-CM | POA: Insufficient documentation

## 2016-06-10 DIAGNOSIS — Y939 Activity, unspecified: Secondary | ICD-10-CM | POA: Insufficient documentation

## 2016-06-10 DIAGNOSIS — Z79899 Other long term (current) drug therapy: Secondary | ICD-10-CM | POA: Insufficient documentation

## 2016-06-10 DIAGNOSIS — Z7982 Long term (current) use of aspirin: Secondary | ICD-10-CM | POA: Insufficient documentation

## 2016-06-10 DIAGNOSIS — I10 Essential (primary) hypertension: Secondary | ICD-10-CM | POA: Insufficient documentation

## 2016-06-10 DIAGNOSIS — S63501A Unspecified sprain of right wrist, initial encounter: Secondary | ICD-10-CM | POA: Diagnosis not present

## 2016-06-10 DIAGNOSIS — S6991XA Unspecified injury of right wrist, hand and finger(s), initial encounter: Secondary | ICD-10-CM | POA: Diagnosis present

## 2016-06-10 DIAGNOSIS — Y999 Unspecified external cause status: Secondary | ICD-10-CM | POA: Diagnosis not present

## 2016-06-10 HISTORY — DX: Unspecified intellectual disabilities: F79

## 2016-06-10 MED ORDER — TRAMADOL HCL 50 MG PO TABS: 50.0000 mg | ORAL_TABLET | Freq: Four times a day (QID) | ORAL | 0 refills | Status: DC | PRN

## 2016-06-10 MED ORDER — TRAMADOL HCL 50 MG PO TABS
50.0000 mg | ORAL_TABLET | Freq: Once | ORAL | Status: AC
  Administered 2016-06-10: 50 mg via ORAL
  Filled 2016-06-10: qty 1

## 2016-06-10 NOTE — ED Notes (Signed)
Applied wrist splint on pt and gave pt ice bag for application of affected area.
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
## 2016-06-11 NOTE — ED Notes (Signed)
This note reflects a telephone discussion with Travis Barrera (Patient's Mother) on 06/11/2016 @ 2010. This Consulting civil engineerCharge RN received call from [facility], NP regarding this patient. Provider requested that this RN contact Mother of patient to inform of needed follow-up for wrist injury. Spoke with mother personally and advised her to contact ImperialKuzma, MD @ [phone] for a follow-up within 1 week. Mother acknowledged and thanked this Charity fundraiserN for contact and information.

## 2016-08-22 ENCOUNTER — Other Ambulatory Visit: Payer: Self-pay | Admitting: *Deleted

## 2016-08-22 MED ORDER — SPIRONOLACTONE 25 MG PO TABS: 25.0000 mg | ORAL_TABLET | Freq: Every day | ORAL | 0 refills | Status: AC

## 2017-03-14 ENCOUNTER — Ambulatory Visit (INDEPENDENT_AMBULATORY_CARE_PROVIDER_SITE_OTHER): Payer: Medicaid Other | Admitting: Podiatry

## 2017-03-14 ENCOUNTER — Encounter: Payer: Self-pay | Admitting: Podiatry

## 2017-03-14 VITALS — BP 137/89 | HR 68 | Resp 18

## 2017-03-14 DIAGNOSIS — M79674 Pain in right toe(s): Secondary | ICD-10-CM | POA: Diagnosis not present

## 2017-03-14 DIAGNOSIS — B351 Tinea unguium: Secondary | ICD-10-CM

## 2017-03-14 DIAGNOSIS — M79675 Pain in left toe(s): Secondary | ICD-10-CM | POA: Diagnosis not present

## 2017-03-14 MED ORDER — CICLOPIROX 8 % EX SOLN: Freq: Every day | CUTANEOUS | 2 refills | Status: AC

## 2017-03-14 NOTE — Progress Notes (Signed)
   Subjective:    Patient ID: Travis Barrera, male    DOB: 09/27/1960, 56 y.o.   MRN: [id number]  HPI  56 year old male presents the also concerns thick, elongated toenails that he cannot trim himself and states that he irritated site shoes. Denies any redness or drainage from the toenail sites. He is a recent treatment for this. He denies any ulceration. He has no other concerns today. Review of Systems  All other systems reviewed and are negative.      Objective:   Physical Exam General: AAO x3, NAD  Dermatological: Nails are hypertrophic, dystrophic, brittle, discolored, elongated 10. No surrounding redness or drainage. Tenderness nails 1-5 bilaterally. No open lesions or pre-ulcerative lesions are identified today.  Vascular: Dorsalis Pedis artery and Posterior Tibial artery pedal pulses are 2/4 bilateral with immedate capillary fill time.  There is no pain with calf compression, swelling, warmth, erythema.   Neruologic: Grossly intact via light touch bilateral. Protective threshold with Semmes Wienstein monofilament intact to all pedal sites bilateral.   Musculoskeletal: No gross boney pedal deformities bilateral. No pain, crepitus, or limitation noted with foot and ankle range of motion bilateral. Muscular strength 5/5 in all groups tested bilateral.      Assessment & Plan:  56 year old male with symptomatic onychomycosis -Treatment options discussed including all alternatives, risks, and complications -Etiology of symptoms were discussed -Nails debrided 10 without complications or bleeding. -He would like treatment for the nail fungus. Discussed various options at this point he was to proceed with topical treatment. Prescribed Penlac. Discussed application instructions as well as potential side effects as well as success rates. -Daily foot inspection -Follow-up in 3 months or sooner if any problems arise. In the meantime, encouraged to call the office with any questions,  concerns, change in symptoms.   Ovid Curd, DPM

## 2017-06-15 ENCOUNTER — Ambulatory Visit (INDEPENDENT_AMBULATORY_CARE_PROVIDER_SITE_OTHER): Payer: Medicaid Other | Admitting: Podiatry

## 2017-06-15 ENCOUNTER — Encounter: Payer: Self-pay | Admitting: Podiatry

## 2017-06-15 DIAGNOSIS — B351 Tinea unguium: Secondary | ICD-10-CM

## 2017-06-15 DIAGNOSIS — M79675 Pain in left toe(s): Secondary | ICD-10-CM

## 2017-06-15 DIAGNOSIS — M79674 Pain in right toe(s): Secondary | ICD-10-CM | POA: Diagnosis not present

## 2017-06-15 NOTE — Progress Notes (Signed)
Complaint:  Visit Type: Patient returns to my office for continued preventative foot care services. Complaint: Patient states" my nails have grown long and thick and become painful to walk and wear shoes" . The patient presents for preventative foot care services. No changes to ROS  Podiatric Exam: Vascular: dorsalis pedis and posterior tibial pulses are palpable bilateral. Capillary return is immediate. Temperature gradient is WNL. Skin turgor WNL  Sensorium: Normal Semmes Weinstein monofilament test. Normal tactile sensation bilaterally. Nail Exam: Pt has thick disfigured discolored nails with subungual debris noted bilateral entire nail hallux through fifth toenails Ulcer Exam: There is no evidence of ulcer or pre-ulcerative changes or infection. Orthopedic Exam: Muscle tone and strength are WNL. No limitations in general ROM. No crepitus or effusions noted. Foot type and digits show no abnormalities. Bony prominences are unremarkable. Skin: No Porokeratosis. No infection or ulcers  Diagnosis:  Onychomycosis, , Pain in right toe, pain in left toes  Treatment & Plan Procedures and Treatment: Consent by patient was obtained for treatment procedures.   Debridement of mycotic and hypertrophic toenails, 1 through 5 bilateral and clearing of subungual debris. No ulceration, no infection noted.  Return Visit-Office Procedure: Patient instructed to return to the office for a follow up visit 3 months for continued evaluation and treatment.    Aneesh Faller DPM 

## 2017-09-14 ENCOUNTER — Ambulatory Visit: Payer: Medicaid Other | Admitting: Podiatry

## 2017-09-14 ENCOUNTER — Encounter: Payer: Self-pay | Admitting: Podiatry

## 2017-09-14 DIAGNOSIS — M79674 Pain in right toe(s): Secondary | ICD-10-CM

## 2017-09-14 DIAGNOSIS — B351 Tinea unguium: Secondary | ICD-10-CM | POA: Diagnosis not present

## 2017-09-14 DIAGNOSIS — M79675 Pain in left toe(s): Secondary | ICD-10-CM | POA: Diagnosis not present

## 2017-09-14 NOTE — Progress Notes (Signed)
Complaint:  Visit Type: Patient returns to my office for continued preventative foot care services. Complaint: Patient states" my nails have grown long and thick and become painful to walk and wear shoes" . The patient presents for preventative foot care services. No changes to ROS  Podiatric Exam: Vascular: dorsalis pedis and posterior tibial pulses are palpable bilateral. Capillary return is immediate. Temperature gradient is WNL. Skin turgor WNL  Sensorium: Normal Semmes Weinstein monofilament test. Normal tactile sensation bilaterally. Nail Exam: Pt has thick disfigured discolored nails with subungual debris noted bilateral entire nail hallux through fifth toenails Ulcer Exam: There is no evidence of ulcer or pre-ulcerative changes or infection. Orthopedic Exam: Muscle tone and strength are WNL. No limitations in general ROM. No crepitus or effusions noted. Foot type and digits show no abnormalities. Bony prominences are unremarkable. Skin: No Porokeratosis. No infection or ulcers  Diagnosis:  Onychomycosis, , Pain in right toe, pain in left toes  Treatment & Plan Procedures and Treatment: Consent by patient was obtained for treatment procedures.   Debridement of mycotic and hypertrophic toenails, 1 through 5 bilateral and clearing of subungual debris. No ulceration, no infection noted.  Return Visit-Office Procedure: Patient instructed to return to the office for a follow up visit 3 months for continued evaluation and treatment.    Nashla Althoff DPM 

## 2017-12-18 ENCOUNTER — Ambulatory Visit: Payer: Medicaid Other | Admitting: Podiatry

## 2018-01-16 ENCOUNTER — Ambulatory Visit: Payer: Medicaid Other | Admitting: Podiatry

## 2018-01-16 ENCOUNTER — Encounter: Payer: Self-pay | Admitting: Podiatry

## 2018-01-16 DIAGNOSIS — B351 Tinea unguium: Secondary | ICD-10-CM | POA: Diagnosis not present

## 2018-01-16 DIAGNOSIS — M79674 Pain in right toe(s): Secondary | ICD-10-CM

## 2018-01-16 DIAGNOSIS — M79675 Pain in left toe(s): Secondary | ICD-10-CM | POA: Diagnosis not present

## 2018-01-16 NOTE — Progress Notes (Signed)
Complaint:  Visit Type: Patient returns to my office for continued preventative foot care services. Complaint: Patient states" my nails have grown long and thick and become painful to walk and wear shoes" . The patient presents for preventative foot care services. No changes to ROS  Podiatric Exam: Vascular: dorsalis pedis and posterior tibial pulses are palpable bilateral. Capillary return is immediate. Temperature gradient is WNL. Skin turgor WNL  Sensorium: Normal Semmes Weinstein monofilament test. Normal tactile sensation bilaterally. Nail Exam: Pt has thick disfigured discolored nails with subungual debris noted bilateral entire nail hallux through fifth toenails Ulcer Exam: There is no evidence of ulcer or pre-ulcerative changes or infection. Orthopedic Exam: Muscle tone and strength are WNL. No limitations in general ROM. No crepitus or effusions noted. Foot type and digits show no abnormalities. Bony prominences are unremarkable. Skin: No Porokeratosis. No infection or ulcers  Diagnosis:  Onychomycosis, , Pain in right toe, pain in left toes  Treatment & Plan Procedures and Treatment: Consent by patient was obtained for treatment procedures.   Debridement of mycotic and hypertrophic toenails, 1 through 5 bilateral and clearing of subungual debris. No ulceration, no infection noted.  Return Visit-Office Procedure: Patient instructed to return to the office for a follow up visit 3 months for continued evaluation and treatment.    Nida Manfredi DPM 

## 2018-04-17 ENCOUNTER — Encounter: Payer: Self-pay | Admitting: Podiatry

## 2018-04-17 ENCOUNTER — Ambulatory Visit: Payer: Medicaid Other | Admitting: Podiatry

## 2018-04-17 DIAGNOSIS — M79675 Pain in left toe(s): Secondary | ICD-10-CM | POA: Diagnosis not present

## 2018-04-17 DIAGNOSIS — M79674 Pain in right toe(s): Secondary | ICD-10-CM

## 2018-04-17 DIAGNOSIS — B351 Tinea unguium: Secondary | ICD-10-CM | POA: Diagnosis not present

## 2018-04-17 NOTE — Progress Notes (Signed)
Complaint:  Visit Type: Patient returns to my office for continued preventative foot care services. Complaint: Patient states" my nails have grown long and thick and become painful to walk and wear shoes" . The patient presents for preventative foot care services. No changes to ROS  Podiatric Exam: Vascular: dorsalis pedis and posterior tibial pulses are palpable bilateral. Capillary return is immediate. Temperature gradient is WNL. Skin turgor WNL  Sensorium: Normal Semmes Weinstein monofilament test. Normal tactile sensation bilaterally. Nail Exam: Pt has thick disfigured discolored nails with subungual debris noted bilateral entire nail hallux through fifth toenails Ulcer Exam: There is no evidence of ulcer or pre-ulcerative changes or infection. Orthopedic Exam: Muscle tone and strength are WNL. No limitations in general ROM. No crepitus or effusions noted. Foot type and digits show no abnormalities. Bony prominences are unremarkable. Skin: No Porokeratosis. No infection or ulcers  Diagnosis:  Onychomycosis, , Pain in right toe, pain in left toes  Treatment & Plan Procedures and Treatment: Consent by patient was obtained for treatment procedures.   Debridement of mycotic and hypertrophic toenails, 1 through 5 bilateral and clearing of subungual debris. No ulceration, no infection noted.  Return Visit-Office Procedure: Patient instructed to return to the office for a follow up visit 3 months for continued evaluation and treatment.    Emmalyn Hinson DPM 

## 2018-07-17 ENCOUNTER — Encounter: Payer: Self-pay | Admitting: Podiatry

## 2018-07-17 ENCOUNTER — Ambulatory Visit: Payer: Medicaid Other | Admitting: Podiatry

## 2018-07-17 DIAGNOSIS — B351 Tinea unguium: Secondary | ICD-10-CM | POA: Diagnosis not present

## 2018-07-17 DIAGNOSIS — M79675 Pain in left toe(s): Secondary | ICD-10-CM

## 2018-07-17 DIAGNOSIS — M79674 Pain in right toe(s): Secondary | ICD-10-CM

## 2018-07-17 NOTE — Progress Notes (Signed)
Complaint:  Visit Type: Patient returns to my office for continued preventative foot care services. Complaint: Patient states" my nails have grown long and thick and become painful to walk and wear shoes" . The patient presents for preventative foot care services. No changes to ROS  Podiatric Exam: Vascular: dorsalis pedis and posterior tibial pulses are palpable bilateral. Capillary return is immediate. Temperature gradient is WNL. Skin turgor WNL  Sensorium: Normal Semmes Weinstein monofilament test. Normal tactile sensation bilaterally. Nail Exam: Pt has thick disfigured discolored nails with subungual debris noted bilateral entire nail hallux through fifth toenails Ulcer Exam: There is no evidence of ulcer or pre-ulcerative changes or infection. Orthopedic Exam: Muscle tone and strength are WNL. No limitations in general ROM. No crepitus or effusions noted. Foot type and digits show no abnormalities. Bony prominences are unremarkable. Skin: No Porokeratosis. No infection or ulcers  Diagnosis:  Onychomycosis, , Pain in right toe, pain in left toes  Treatment & Plan Procedures and Treatment: Consent by patient was obtained for treatment procedures.   Debridement of mycotic and hypertrophic toenails, 1 through 5 bilateral and clearing of subungual debris. No ulceration, no infection noted.  Return Visit-Office Procedure: Patient instructed to return to the office for a follow up visit 3 months for continued evaluation and treatment.    Braeleigh Pyper DPM 
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
## 2018-10-16 ENCOUNTER — Ambulatory Visit: Payer: Medicaid Other | Admitting: Podiatry

## 2020-08-09 ENCOUNTER — Other Ambulatory Visit: Payer: Self-pay | Admitting: Internal Medicine

## 2020-08-10 LAB — CBC
HCT: 40.9 % (ref 38.5–50.0)
Hemoglobin: 13.9 g/dL (ref 13.2–17.1)
MCH: 35.3 pg — ABNORMAL HIGH (ref 27.0–33.0)
MCHC: 34 g/dL (ref 32.0–36.0)
MCV: 103.8 fL — ABNORMAL HIGH (ref 80.0–100.0)
MPV: 11.6 fL (ref 7.5–12.5)
Platelets: 419 10*3/uL — ABNORMAL HIGH (ref 140–400)
RBC: 3.94 10*6/uL — ABNORMAL LOW (ref 4.20–5.80)
RDW: 11.9 % (ref 11.0–15.0)
WBC: 4.5 10*3/uL (ref 3.8–10.8)

## 2020-08-10 LAB — COMPLETE METABOLIC PANEL WITH GFR
AG Ratio: 1.5 (calc) (ref 1.0–2.5)
ALT: 24 U/L (ref 9–46)
AST: 28 U/L (ref 10–35)
Albumin: 4.5 g/dL (ref 3.6–5.1)
Alkaline phosphatase (APISO): 63 U/L (ref 35–144)
BUN/Creatinine Ratio: 12 (calc) (ref 6–22)
BUN: 22 mg/dL (ref 7–25)
CO2: 27 mmol/L (ref 20–32)
Calcium: 9.5 mg/dL (ref 8.6–10.3)
Chloride: 99 mmol/L (ref 98–110)
Creat: 1.91 mg/dL — ABNORMAL HIGH (ref 0.70–1.25)
GFR, Est African American: 43 mL/min/{1.73_m2} — ABNORMAL LOW (ref >=60)
GFR, Est Non African American: 37 mL/min/{1.73_m2} — ABNORMAL LOW (ref >=60)
Globulin: 3.1 g/dL (calc) (ref 1.9–3.7)
Glucose, Bld: 130 mg/dL — ABNORMAL HIGH (ref 65–99)
Potassium: 3.9 mmol/L (ref 3.5–5.3)
Sodium: 138 mmol/L (ref 135–146)
Total Bilirubin: 1 mg/dL (ref 0.2–1.2)
Total Protein: 7.6 g/dL (ref 6.1–8.1)

## 2020-08-10 LAB — VITAMIN D 25 HYDROXY (VIT D DEFICIENCY, FRACTURES): Vit D, 25-Hydroxy: 25 ng/mL — ABNORMAL LOW (ref 30–100)

## 2020-08-10 LAB — TSH: TSH: 0.98 mIU/L (ref 0.40–4.50)

## 2020-08-10 LAB — LIPID PANEL
Cholesterol: 166 mg/dL (ref <200)
HDL: 35 mg/dL — ABNORMAL LOW (ref >=40)
LDL Cholesterol (Calc): 106 mg/dL (calc) — ABNORMAL HIGH
Non-HDL Cholesterol (Calc): 131 mg/dL (calc) — ABNORMAL HIGH (ref <130)
Total CHOL/HDL Ratio: 4.7 (calc) (ref <5.0)
Triglycerides: 142 mg/dL (ref <150)

## 2020-08-10 LAB — PSA: PSA: 0.68 ng/mL (ref <=4.0)

## 2020-09-14 ENCOUNTER — Encounter: Payer: Self-pay | Admitting: Podiatry

## 2020-09-14 ENCOUNTER — Ambulatory Visit: Payer: Medicaid Other | Admitting: Podiatry

## 2020-09-14 ENCOUNTER — Other Ambulatory Visit: Payer: Self-pay

## 2020-09-14 DIAGNOSIS — B351 Tinea unguium: Secondary | ICD-10-CM

## 2020-09-14 DIAGNOSIS — M79675 Pain in left toe(s): Secondary | ICD-10-CM

## 2020-09-14 DIAGNOSIS — M79674 Pain in right toe(s): Secondary | ICD-10-CM

## 2020-09-14 NOTE — Progress Notes (Signed)
This patient returns to my office for at risk foot care.  This patient requires this care by a professional since this patient will be at risk due to having  This patient is unable to cut nails himself since the patient cannot reach his nails.These nails are painful walking and wearing shoes.  He presents to the office with male caregiver.  This patient presents for at risk foot care today.  General Appearance  Alert, conversant and in no acute stress.  Vascular  Dorsalis pedis and posterior tibial  pulses are palpable  bilaterally.  Capillary return is within normal limits  bilaterally. Temperature is within normal limits  bilaterally.  Neurologic  Senn-Weinstein monofilament wire test within normal limits  bilaterally. Muscle power within normal limits bilaterally.  Nails Thick disfigured discolored nails with subungual debris  from hallux to fifth toes bilaterally. No evidence of bacterial infection or drainage bilaterally.  Orthopedic  No limitations of motion  feet .  No crepitus or effusions noted.  No bony pathology or digital deformities noted.  Skin  normotropic skin with no porokeratosis noted bilaterally.  No signs of infections or ulcers noted.     Onychomycosis  Pain in right toes  Pain in left toes  Consent was obtained for treatment procedures.   Mechanical debridement of nails 1-5  bilaterally performed with a nail nipper.  Filed with dremel without incident.    Return office visit   6 months                  Told patient to return for periodic foot care and evaluation due to potential at risk complications.   Helane Gunther DPM

## 2020-12-15 ENCOUNTER — Ambulatory Visit: Payer: Medicaid Other | Admitting: Podiatry

## 2020-12-20 ENCOUNTER — Ambulatory Visit (INDEPENDENT_AMBULATORY_CARE_PROVIDER_SITE_OTHER): Payer: Medicaid Other | Admitting: Podiatry

## 2020-12-20 ENCOUNTER — Encounter: Payer: Self-pay | Admitting: Podiatry

## 2020-12-20 ENCOUNTER — Other Ambulatory Visit: Payer: Self-pay

## 2020-12-20 DIAGNOSIS — B351 Tinea unguium: Secondary | ICD-10-CM

## 2020-12-20 DIAGNOSIS — M79675 Pain in left toe(s): Secondary | ICD-10-CM | POA: Diagnosis not present

## 2020-12-20 DIAGNOSIS — M79674 Pain in right toe(s): Secondary | ICD-10-CM

## 2020-12-20 NOTE — Progress Notes (Signed)
This patient returns to my office for at risk foot care.  This patient requires this care by a professional since this patient will be at risk due to having This patient is unable to cut nails himself since the patient cannot reach his nails.These nails are painful walking and wearing shoes.  This patient presents for at risk foot care today.  General Appearance  Alert, conversant and in no acute stress.  Vascular  Dorsalis pedis and posterior tibial  pulses are palpable  bilaterally.  Capillary return is within normal limits  bilaterally. Temperature is within normal limits  bilaterally.  Neurologic  Senn-Weinstein monofilament wire test within normal limits  bilaterally. Muscle power within normal limits bilaterally.  Nails Thick disfigured discolored nails with subungual debris  from hallux to fifth toes bilaterally. No evidence of bacterial infection or drainage bilaterally.  Orthopedic  No limitations of motion  feet .  No crepitus or effusions noted.  No bony pathology or digital deformities noted.  Skin  normotropic skin with no porokeratosis noted bilaterally.  No signs of infections or ulcers noted.     Onychomycosis  Pain in right toes  Pain in left toes  Consent was obtained for treatment procedures.   Mechanical debridement of nails 1-5  bilaterally performed with a nail nipper.  Filed with dremel without incident.    Return office visit     3 months                Told patient to return for periodic foot care and evaluation due to potential at risk complications.   Herberth Deharo DPM  

## 2021-03-28 ENCOUNTER — Ambulatory Visit (INDEPENDENT_AMBULATORY_CARE_PROVIDER_SITE_OTHER): Payer: Medicaid Other | Admitting: Podiatry

## 2021-03-28 ENCOUNTER — Other Ambulatory Visit: Payer: Self-pay

## 2021-03-28 ENCOUNTER — Encounter: Payer: Self-pay | Admitting: Podiatry

## 2021-03-28 DIAGNOSIS — M79674 Pain in right toe(s): Secondary | ICD-10-CM

## 2021-03-28 DIAGNOSIS — B351 Tinea unguium: Secondary | ICD-10-CM | POA: Diagnosis not present

## 2021-03-28 DIAGNOSIS — M79675 Pain in left toe(s): Secondary | ICD-10-CM

## 2021-03-28 NOTE — Progress Notes (Signed)
This patient returns to my office for at risk foot care.  This patient requires this care by a professional since this patient will be at risk due to having This patient is unable to cut nails himself since the patient cannot reach his nails.These nails are painful walking and wearing shoes.  This patient presents for at risk foot care today.  General Appearance  Alert, conversant and in no acute stress.  Vascular  Dorsalis pedis and posterior tibial  pulses are palpable  bilaterally.  Capillary return is within normal limits  bilaterally. Temperature is within normal limits  bilaterally.  Neurologic  Senn-Weinstein monofilament wire test within normal limits  bilaterally. Muscle power within normal limits bilaterally.  Nails Thick disfigured discolored nails with subungual debris  from hallux to fifth toes bilaterally. No evidence of bacterial infection or drainage bilaterally.  Orthopedic  No limitations of motion  feet .  No crepitus or effusions noted.  No bony pathology or digital deformities noted.  Skin  normotropic skin with no porokeratosis noted bilaterally.  No signs of infections or ulcers noted.     Onychomycosis  Pain in right toes  Pain in left toes  Consent was obtained for treatment procedures.   Mechanical debridement of nails 1-5  bilaterally performed with a nail nipper.  Filed with dremel without incident.    Return office visit     3 months                Told patient to return for periodic foot care and evaluation due to potential at risk complications.   Aaryan Essman DPM  

## 2021-06-28 ENCOUNTER — Ambulatory Visit (INDEPENDENT_AMBULATORY_CARE_PROVIDER_SITE_OTHER): Payer: PRIVATE HEALTH INSURANCE | Admitting: Podiatry

## 2021-06-28 ENCOUNTER — Other Ambulatory Visit: Payer: Self-pay

## 2021-06-28 ENCOUNTER — Encounter: Payer: Self-pay | Admitting: Podiatry

## 2021-06-28 DIAGNOSIS — B351 Tinea unguium: Secondary | ICD-10-CM | POA: Diagnosis not present

## 2021-06-28 DIAGNOSIS — M79675 Pain in left toe(s): Secondary | ICD-10-CM

## 2021-06-28 DIAGNOSIS — M79674 Pain in right toe(s): Secondary | ICD-10-CM | POA: Diagnosis not present

## 2021-06-28 NOTE — Progress Notes (Signed)
This patient returns to my office for at risk foot care.  This patient requires this care by a professional since this patient will be at risk due to having This patient is unable to cut nails himself since the patient cannot reach his nails.These nails are painful walking and wearing shoes.  This patient presents for at risk foot care today.  General Appearance  Alert, conversant and in no acute stress.  Vascular  Dorsalis pedis and posterior tibial  pulses are palpable  bilaterally.  Capillary return is within normal limits  bilaterally. Temperature is within normal limits  bilaterally.  Neurologic  Senn-Weinstein monofilament wire test within normal limits  bilaterally. Muscle power within normal limits bilaterally.  Nails Thick disfigured discolored nails with subungual debris  from hallux to fifth toes bilaterally. No evidence of bacterial infection or drainage bilaterally.  Orthopedic  No limitations of motion  feet .  No crepitus or effusions noted.  No bony pathology or digital deformities noted.  Skin  normotropic skin with no porokeratosis noted bilaterally.  No signs of infections or ulcers noted.     Onychomycosis  Pain in right toes  Pain in left toes  Consent was obtained for treatment procedures.   Mechanical debridement of nails 1-5  bilaterally performed with a nail nipper.  Filed with dremel without incident.    Return office visit     3 months                Told patient to return for periodic foot care and evaluation due to potential at risk complications.   Libni Fusaro DPM  

## 2021-08-24 ENCOUNTER — Encounter: Payer: Self-pay | Admitting: Physician Assistant

## 2021-09-05 ENCOUNTER — Ambulatory Visit: Payer: Medicaid Other | Admitting: Physician Assistant

## 2021-09-28 ENCOUNTER — Ambulatory Visit: Payer: PRIVATE HEALTH INSURANCE | Admitting: Podiatry

## 2021-09-29 ENCOUNTER — Ambulatory Visit: Payer: Medicaid Other | Admitting: Physician Assistant

## 2021-11-04 ENCOUNTER — Ambulatory Visit: Payer: Medicaid Other | Admitting: Physician Assistant
